# Patient Record
Sex: Female | Born: 1950 | Race: Black or African American | Hispanic: No | Marital: Single | Smoking: Current some day smoker
Health system: Southern US, Community
[De-identification: ages and names within clinical notes are randomized; demographics above are authoritative.]

## PROBLEM LIST (undated history)

## (undated) DIAGNOSIS — M1990 Unspecified osteoarthritis, unspecified site: Secondary | ICD-10-CM

## (undated) DIAGNOSIS — I10 Essential (primary) hypertension: Secondary | ICD-10-CM

## (undated) DIAGNOSIS — H409 Unspecified glaucoma: Secondary | ICD-10-CM

## (undated) DIAGNOSIS — I509 Heart failure, unspecified: Secondary | ICD-10-CM

## (undated) HISTORY — PX: WISDOM TOOTH EXTRACTION: SHX21

---

## 1997-07-08 ENCOUNTER — Other Ambulatory Visit: Admission: RE | Admit: 1997-07-08 | Discharge: 1997-07-08 | Payer: Self-pay | Admitting: Family Medicine

## 1997-07-27 ENCOUNTER — Other Ambulatory Visit: Admission: RE | Admit: 1997-07-27 | Discharge: 1997-07-27 | Payer: Self-pay | Admitting: Family Medicine

## 1998-08-18 ENCOUNTER — Other Ambulatory Visit: Admission: RE | Admit: 1998-08-18 | Discharge: 1998-08-18 | Payer: Self-pay | Admitting: Family Medicine

## 1998-12-20 ENCOUNTER — Encounter: Payer: Self-pay | Admitting: Emergency Medicine

## 1998-12-20 ENCOUNTER — Emergency Department (HOSPITAL_COMMUNITY): Admission: EM | Admit: 1998-12-20 | Discharge: 1998-12-20 | Payer: Self-pay | Admitting: Emergency Medicine

## 2000-02-24 ENCOUNTER — Other Ambulatory Visit: Admission: RE | Admit: 2000-02-24 | Discharge: 2000-02-24 | Payer: Self-pay | Admitting: Family Medicine

## 2000-05-23 ENCOUNTER — Ambulatory Visit (HOSPITAL_COMMUNITY): Admission: RE | Admit: 2000-05-23 | Discharge: 2000-05-23 | Payer: Self-pay | Admitting: Family Medicine

## 2000-05-23 ENCOUNTER — Encounter: Payer: Self-pay | Admitting: Family Medicine

## 2002-07-28 ENCOUNTER — Emergency Department (HOSPITAL_COMMUNITY): Admission: EM | Admit: 2002-07-28 | Discharge: 2002-07-28 | Payer: Self-pay | Admitting: Emergency Medicine

## 2003-10-07 ENCOUNTER — Ambulatory Visit: Payer: Self-pay | Admitting: Family Medicine

## 2003-11-13 ENCOUNTER — Ambulatory Visit: Payer: Self-pay | Admitting: Family Medicine

## 2004-07-19 ENCOUNTER — Ambulatory Visit: Payer: Self-pay | Admitting: Family Medicine

## 2004-09-01 ENCOUNTER — Ambulatory Visit: Payer: Self-pay | Admitting: Family Medicine

## 2004-09-22 ENCOUNTER — Ambulatory Visit: Payer: Self-pay | Admitting: Family Medicine

## 2005-01-19 ENCOUNTER — Ambulatory Visit: Payer: Self-pay | Admitting: Internal Medicine

## 2005-02-06 ENCOUNTER — Ambulatory Visit: Payer: Self-pay | Admitting: Family Medicine

## 2005-02-22 ENCOUNTER — Ambulatory Visit: Payer: Self-pay | Admitting: Family Medicine

## 2005-05-24 ENCOUNTER — Ambulatory Visit: Payer: Self-pay | Admitting: Internal Medicine

## 2005-07-05 ENCOUNTER — Ambulatory Visit: Payer: Self-pay | Admitting: Family Medicine

## 2006-04-02 ENCOUNTER — Ambulatory Visit: Payer: Self-pay | Admitting: Family Medicine

## 2006-05-14 ENCOUNTER — Ambulatory Visit: Payer: Self-pay | Admitting: Family Medicine

## 2006-07-02 ENCOUNTER — Ambulatory Visit: Payer: Self-pay | Admitting: Family Medicine

## 2006-08-28 ENCOUNTER — Ambulatory Visit: Payer: Self-pay | Admitting: Cardiology

## 2006-08-28 ENCOUNTER — Observation Stay (HOSPITAL_COMMUNITY): Admission: EM | Admit: 2006-08-28 | Discharge: 2006-08-28 | Payer: Self-pay | Admitting: Emergency Medicine

## 2006-08-28 ENCOUNTER — Encounter: Payer: Self-pay | Admitting: Cardiology

## 2006-09-06 DIAGNOSIS — E669 Obesity, unspecified: Secondary | ICD-10-CM

## 2006-09-06 DIAGNOSIS — K219 Gastro-esophageal reflux disease without esophagitis: Secondary | ICD-10-CM

## 2006-09-06 DIAGNOSIS — L259 Unspecified contact dermatitis, unspecified cause: Secondary | ICD-10-CM

## 2006-09-06 DIAGNOSIS — R519 Headache, unspecified: Secondary | ICD-10-CM | POA: Insufficient documentation

## 2006-09-06 DIAGNOSIS — F329 Major depressive disorder, single episode, unspecified: Secondary | ICD-10-CM

## 2006-09-06 DIAGNOSIS — I10 Essential (primary) hypertension: Secondary | ICD-10-CM | POA: Insufficient documentation

## 2006-09-06 DIAGNOSIS — M1990 Unspecified osteoarthritis, unspecified site: Secondary | ICD-10-CM | POA: Insufficient documentation

## 2006-09-06 DIAGNOSIS — F411 Generalized anxiety disorder: Secondary | ICD-10-CM | POA: Insufficient documentation

## 2006-09-06 DIAGNOSIS — R51 Headache: Secondary | ICD-10-CM

## 2006-09-06 DIAGNOSIS — F17200 Nicotine dependence, unspecified, uncomplicated: Secondary | ICD-10-CM

## 2007-02-25 ENCOUNTER — Encounter (INDEPENDENT_AMBULATORY_CARE_PROVIDER_SITE_OTHER): Payer: Self-pay | Admitting: Family Medicine

## 2007-02-25 ENCOUNTER — Ambulatory Visit: Payer: Self-pay | Admitting: Family Medicine

## 2007-03-18 ENCOUNTER — Ambulatory Visit: Payer: Self-pay

## 2007-04-17 ENCOUNTER — Ambulatory Visit: Payer: Self-pay | Admitting: Internal Medicine

## 2007-11-14 ENCOUNTER — Ambulatory Visit: Payer: Self-pay | Admitting: Internal Medicine

## 2007-11-14 ENCOUNTER — Encounter (INDEPENDENT_AMBULATORY_CARE_PROVIDER_SITE_OTHER): Payer: Self-pay | Admitting: Adult Health

## 2007-11-14 LAB — CONVERTED CEMR LAB
ALT: 13 units/L (ref 0–35)
BUN: 11 mg/dL (ref 6–23)
Basophils Absolute: 0 10*3/uL (ref 0.0–0.1)
Basophils Relative: 0 % (ref 0–1)
CO2: 27 meq/L (ref 19–32)
Calcium: 9.4 mg/dL (ref 8.4–10.5)
Chloride: 104 meq/L (ref 96–112)
Cholesterol: 159 mg/dL (ref 0–200)
Creatinine, Ser: 0.69 mg/dL (ref 0.40–1.20)
Eosinophils Relative: 5 % (ref 0–5)
HCT: 44.3 % (ref 36.0–46.0)
HDL: 50 mg/dL (ref >39)
Hemoglobin: 14.4 g/dL (ref 12.0–15.0)
Lymphocytes Relative: 52 % — ABNORMAL HIGH (ref 12–46)
MCHC: 32.5 g/dL (ref 30.0–36.0)
Monocytes Absolute: 0.5 10*3/uL (ref 0.1–1.0)
Monocytes Relative: 6 % (ref 3–12)
Neutro Abs: 2.9 10*3/uL (ref 1.7–7.7)
RBC: 4.83 M/uL (ref 3.87–5.11)
RDW: 14.8 % (ref 11.5–15.5)
Total Bilirubin: 0.3 mg/dL (ref 0.3–1.2)
Total CHOL/HDL Ratio: 3.2
VLDL: 29 mg/dL (ref 0–40)

## 2008-03-18 ENCOUNTER — Encounter (INDEPENDENT_AMBULATORY_CARE_PROVIDER_SITE_OTHER): Payer: Self-pay | Admitting: Adult Health

## 2008-03-18 ENCOUNTER — Ambulatory Visit: Payer: Self-pay | Admitting: Family Medicine

## 2008-03-18 LAB — CONVERTED CEMR LAB
BUN: 13 mg/dL (ref 6–23)
Basophils Absolute: 0 10*3/uL (ref 0.0–0.1)
Basophils Relative: 0 % (ref 0–1)
CO2: 24 meq/L (ref 19–32)
Creatinine, Ser: 0.61 mg/dL (ref 0.40–1.20)
Eosinophils Absolute: 0.2 10*3/uL (ref 0.0–0.7)
Eosinophils Relative: 3 % (ref 0–5)
Glucose, Bld: 105 mg/dL — ABNORMAL HIGH (ref 70–99)
HCT: 42.7 % (ref 36.0–46.0)
Hemoglobin: 14 g/dL (ref 12.0–15.0)
MCHC: 32.8 g/dL (ref 30.0–36.0)
MCV: 89.9 fL (ref 78.0–100.0)
Monocytes Absolute: 0.6 10*3/uL (ref 0.1–1.0)
Monocytes Relative: 7 % (ref 3–12)
Platelets: 247 10*3/uL (ref 150–400)
RDW: 15.3 % (ref 11.5–15.5)
Sodium: 144 meq/L (ref 135–145)
TSH: 0.764 microintl units/mL (ref 0.350–4.500)
Total Bilirubin: 0.4 mg/dL (ref 0.3–1.2)
Total Protein: 7.7 g/dL (ref 6.0–8.3)

## 2008-03-19 ENCOUNTER — Encounter (INDEPENDENT_AMBULATORY_CARE_PROVIDER_SITE_OTHER): Payer: Self-pay | Admitting: Adult Health

## 2008-05-19 ENCOUNTER — Ambulatory Visit (HOSPITAL_COMMUNITY): Admission: RE | Admit: 2008-05-19 | Discharge: 2008-05-19 | Payer: Self-pay | Admitting: Family Medicine

## 2008-06-11 ENCOUNTER — Encounter: Admission: RE | Admit: 2008-06-11 | Discharge: 2008-06-11 | Payer: Self-pay | Admitting: Internal Medicine

## 2008-06-11 DIAGNOSIS — N63 Unspecified lump in unspecified breast: Secondary | ICD-10-CM

## 2008-06-19 ENCOUNTER — Encounter (INDEPENDENT_AMBULATORY_CARE_PROVIDER_SITE_OTHER): Payer: Self-pay | Admitting: Adult Health

## 2008-06-19 ENCOUNTER — Ambulatory Visit: Payer: Self-pay | Admitting: Family Medicine

## 2008-06-19 LAB — CONVERTED CEMR LAB
BUN: 12 mg/dL (ref 6–23)
Creatinine, Ser: 0.71 mg/dL (ref 0.40–1.20)
Potassium: 3.8 meq/L (ref 3.5–5.3)

## 2008-09-09 DIAGNOSIS — Z8639 Personal history of other endocrine, nutritional and metabolic disease: Secondary | ICD-10-CM

## 2008-09-09 DIAGNOSIS — Z87891 Personal history of nicotine dependence: Secondary | ICD-10-CM

## 2008-09-09 DIAGNOSIS — E119 Type 2 diabetes mellitus without complications: Secondary | ICD-10-CM | POA: Insufficient documentation

## 2008-09-09 DIAGNOSIS — Z9189 Other specified personal risk factors, not elsewhere classified: Secondary | ICD-10-CM | POA: Insufficient documentation

## 2008-09-09 DIAGNOSIS — Z862 Personal history of diseases of the blood and blood-forming organs and certain disorders involving the immune mechanism: Secondary | ICD-10-CM

## 2008-10-19 ENCOUNTER — Encounter (INDEPENDENT_AMBULATORY_CARE_PROVIDER_SITE_OTHER): Payer: Self-pay | Admitting: Adult Health

## 2008-10-19 ENCOUNTER — Ambulatory Visit: Payer: Self-pay | Admitting: Internal Medicine

## 2008-10-19 LAB — CONVERTED CEMR LAB
AST: 16 units/L (ref 0–37)
Albumin: 4.3 g/dL (ref 3.5–5.2)
Alkaline Phosphatase: 99 units/L (ref 39–117)
Calcium: 9.7 mg/dL (ref 8.4–10.5)
Chloride: 107 meq/L (ref 96–112)
Glucose, Bld: 58 mg/dL — ABNORMAL LOW (ref 70–99)
LDL Cholesterol: 115 mg/dL — ABNORMAL HIGH (ref 0–99)
Potassium: 3.8 meq/L (ref 3.5–5.3)
Sodium: 145 meq/L (ref 135–145)
Total Protein: 7.3 g/dL (ref 6.0–8.3)

## 2008-11-17 ENCOUNTER — Ambulatory Visit: Payer: Self-pay | Admitting: Internal Medicine

## 2008-12-18 ENCOUNTER — Encounter (INDEPENDENT_AMBULATORY_CARE_PROVIDER_SITE_OTHER): Payer: Self-pay | Admitting: Adult Health

## 2008-12-18 ENCOUNTER — Ambulatory Visit: Payer: Self-pay | Admitting: Internal Medicine

## 2008-12-18 LAB — CONVERTED CEMR LAB
ALT: 10 units/L (ref 0–35)
Albumin: 3.9 g/dL (ref 3.5–5.2)
CO2: 25 meq/L (ref 19–32)
Calcium: 8.8 mg/dL (ref 8.4–10.5)
Chloride: 105 meq/L (ref 96–112)
Cholesterol: 167 mg/dL (ref 0–200)
Sodium: 143 meq/L (ref 135–145)
Total Protein: 6.9 g/dL (ref 6.0–8.3)

## 2009-02-10 ENCOUNTER — Ambulatory Visit: Payer: Self-pay | Admitting: Internal Medicine

## 2009-02-16 ENCOUNTER — Ambulatory Visit: Payer: Self-pay | Admitting: Internal Medicine

## 2009-02-18 ENCOUNTER — Ambulatory Visit: Payer: Self-pay | Admitting: Internal Medicine

## 2009-05-14 ENCOUNTER — Ambulatory Visit: Payer: Self-pay | Admitting: Internal Medicine

## 2010-02-07 ENCOUNTER — Encounter: Payer: Self-pay | Admitting: Internal Medicine

## 2010-02-07 ENCOUNTER — Encounter (INDEPENDENT_AMBULATORY_CARE_PROVIDER_SITE_OTHER): Payer: Self-pay | Admitting: *Deleted

## 2010-02-07 LAB — CONVERTED CEMR LAB
Albumin: 3.9 g/dL (ref 3.5–5.2)
Alkaline Phosphatase: 96 units/L (ref 39–117)
CO2: 27 meq/L (ref 19–32)
Chloride: 105 meq/L (ref 96–112)
Glucose, Bld: 97 mg/dL (ref 70–99)
Potassium: 4.2 meq/L (ref 3.5–5.3)
Sodium: 144 meq/L (ref 135–145)
Total Protein: 7.1 g/dL (ref 6.0–8.3)

## 2010-05-24 NOTE — H&P (Signed)
NAMEMARAYA, GWILLIAM                ACCOUNT NO.:  [id number]   MEDICAL RECORD NO.:  [id number]          PATIENT TYPE:  INP   LOCATION:  3733                         FACILITY:  [facility]   PHYSICIAN:  Gerrit Friends. Dietrich Pates, MD, FACCDATE OF BIRTH:  [birth year]   DATE OF ADMISSION:  08/28/2006  DATE OF DISCHARGE:                              HISTORY & PHYSICAL   PRIMARY CARDIOLOGIST:  Will be new, Dr. Villisca Bing   PRIMARY CARE PHYSICIAN:  Dr. Margarette Canada at [facility]   HISTORY OF PRESENT ILLNESS:  This is a 60 year old African-American  female who woke up this morning with severe chest pain around 3:30 a.m.  which she described as heaviness, nausea, extreme pressure across her  chest lasting 15 to 20 minutes; she had a little trouble breathing and  felt warm all over with mild dizziness; she called EMS, took one baby  aspirin.  She was given nitroglycerin sublingual by EMS and four baby  aspirin, placed on O2 and had some relief of pain.  On arrival to the  emergency room, she was given morphine IV and feels much better.  She  denies any prior cardiac workup, cardiac catheterization or stress test.  She has had some kind of feelings of this before in the past when she  drank something with a lot of bubbles in it and she felt like it was  related to that, but this is the most severe she has felt.   REVIEW OF SYSTEMS:  Positive for headache, chest pain, mild shortness of  breath, dyspnea on exertion, arthralgia in the knees and back, mild  nausea, history of GERD and abdominal pain.   PAST MEDICAL HISTORY:  1. Hypertension.  2. Diabetes.  3. Glaucoma.  4. Arthritis.  5. Questionable hypercholesterolemia.   PAST SURGICAL HISTORY:  C-section.   SOCIAL:  She lives in [place] with her son.  She is unemployed.  She  is single.  She is a three-pack-a-day smoker for 35 years, negative for  ETOH, negative for drugs.   FAMILY HISTORY:  Mother died from complications of heart disease.  Father died of a questionable MI.  She has siblings, but they have  unknown health issues.   CURRENT MEDICATIONS AT HOME:  1. Benazepril/HCTZ 20/25 mg once day.  2. Metoprolol 100 mg once a day.  3. Simvastatin 20 mg at h.s.  4. Ibuprofen 800 mg three times a day.  5. eye drops twice a day.  6. Benicar 20 mg once a day.  7. Metformin 500 mg b.i.d.  8. Atenolol 50 mg once a day.   ALLERGIES:  No known drug allergies.   LABS:  Sodium 141, potassium 3.1, chloride 108, CO2 36, BUN 8,  creatinine 0.6, glucose 154, hemoglobin 15.0, hematocrit 44.0.  Point-of-  care markers:  Myoglobin 36.5, CK-MB less than 1.0, troponin less than  0.05.   EKG revealing normal sinus rhythm with ventricular rate of 80 beats per  minute.   PHYSICAL EXAMINATION:  VITAL SIGNS:  Blood pressure 153/77, pulse 90,  respirations 22, temperature 97.9, O2 sat 100% on three liters.  HEENT:  Head is normocephalic, atraumatic.  Eyes:  PERRLA.  Mucous  membranes and mouth are pink and moist.  Tongue is midline.  NECK:  Supple.  There is no JVD.  No carotid bruits appreciated.  CARDIOVASCULAR:  Regular rate and rhythm without murmurs, rubs or  gallops.  Pulses are 2+ and equal without bruits.  LUNGS:  Clear to auscultation without wheezes, rales or rhonchi.  ABDOMEN:  Obese, nontender.  2+ bowel sounds.  No rebound or guarding.  EXTREMITIES:  Without clubbing, cyanosis or edema.  MUSCULOSKELETAL:  She does have some pain in her knees and lower back,  but no deformities are noted.  NEURO:  Cranial nerves II-XII are grossly intact.   Chest x-ray reveals mild bronchitic changes and hyperinflation.   CARDIOVASCULAR RISK FACTORS:  Hypertension, diabetes, tobacco, family  history, obesity and unknown lipid levels.   IMPRESSION:  1. Chest pain.  2. Diabetes.  3. Hypertension.  4. Obesity.  5. Tobacco abuse.  6. Hypokalemia.   PLAN:  The patient has been interviewed and examined by Dr. [place] Bing.  The  patient has multiple cardiovascular risk factors,  worrisome symptoms with benign exam and EKG.  The patient will be  admitted for 24-hour observation, rule out myocardial infarction, acute  cholecystitis, and evaluate GI with amylase and lipase.  If the  initial workup is negative, she will be scheduled for an outpatient  stress test.  Smoking cessation will be completed during  hospitalization.  We will replete her potassium and check hemoglobin  A1c.  An echocardiogram will also be completed for left ventricular  function with known history of hypertension and diabetes.      Bettey Mare. Lyman Bishop, NP      Gerrit Friends. Dietrich Pates, MD, [facility]  Electronically Signed    KML/MEDQ  D:  08/28/2006  T:  08/28/2006  Job:  [id number]

## 2010-05-27 NOTE — Discharge Summary (Signed)
Rachel Wilcox, Rachel Wilcox                ACCOUNT NO.:  [id number]   MEDICAL RECORD NO.:  [id number]          PATIENT TYPE:  OBV   LOCATION:  3733                         FACILITY:  [facility]   PHYSICIAN:  Gerrit Friends. Dietrich Pates, MD, FACCDATE OF BIRTH:  [birth year]   DATE OF ADMISSION:  08/28/2006  DATE OF DISCHARGE:  08/28/2006                               DISCHARGE SUMMARY   PRIMARY CARDIOLOGIST:  Gerrit Friends. Dietrich Pates, MD, [facility]   PRIMARY CARE PHYSICIAN:  Dr. Margarette Canada at [facility].   FINAL DIAGNOSES:  1. Chest pain, unknown etiology.  2. Against medical advice sign out.  3. Diabetes.  4. Glaucoma.  5. Arthritis.  6. Questionable hypercholesterolemia.   PROCEDURES PERFORMED DURING HOSPITALIZATION:  None.   HOSPITAL COURSE:  This is a 60 year old African American female who woke  up the morning of admission with severe chest pain around 3:30 a.m.  described as heaviness, nausea with extreme pressure across her chest  lasting 15 to 20 minutes with trouble breathing.  Felt warm all over  with mild dizziness.  EMS was called.  She was given baby aspirin,  nitroglycerin, placed on oxygen and presented to the emergency room.  On  arrival to the emergency room the patient was given morphine and felt  much better.  She has had no prior cardiac work up or catheterization  prior to this admission.  The patient was admitted to rule out  myocardial infarction and plan for further cardiac work up.  The patient  would be scheduled for a stress test versus cardiac catheterization  depending upon results of cardiac markers.  Unfortunately, the patient  refused to stay in the hospital secondary to severe agitation.  The  patient also wanted to smoke and not take medications as prescribed.  It  was advised that the patient remain in her room, stop smoking and accept  medication and treatment.  She refused and signed out AMA the evening of  August 28, 2006 with no further cardiac work up or testing.  The  patient  did not accept prescriptions for home or accept a follow up appointment.  During her brief hospitalization, the patient did have cardiac markers  cycled which were found to be negative with a troponin of 0.04 and 0.03,  respectively.  The patient's urine drug screen was positive for opiates.  The patient also was negative for marijuana, cocaine or amphetamines.  The patient's chest x-ray did not reveal any acute cardiac process.  Her  abdomen was also evaluated revealing nondistended thick wall gallbladder  despite the patient having been NPO, suggesting chronic cholecystitis  with 2.13 mm hepatic cysts which were found on previous studies.   Unfortunately, the patient did not stay long enough for any further  testing having signed out AMA.  The patient has no follow up appointment  and it is hopeful that the patient will followup with her primary care  physician, Dr. Margarette Canada, at [facility].      Bettey Mare. Lyman Bishop, NP      Gerrit Friends. Dietrich Pates, MD, [facility]  Electronically Signed    KML/MEDQ  D:  09/27/2006  T:  09/28/2006  Job:  [id number]   cc:   Dr. Margarette Canada

## 2010-10-21 LAB — PROTIME-INR
INR: 1
Prothrombin Time: 13.1

## 2010-10-21 LAB — CARDIAC PANEL(CRET KIN+CKTOT+MB+TROPI)
Relative Index: 1.1
Total CK: 109
Troponin I: 0.03

## 2010-10-21 LAB — POCT CARDIAC MARKERS
Myoglobin, poc: 36.5
Operator id: 279831
Operator id: 279831
Troponin i, poc: <0.05

## 2010-10-21 LAB — BASIC METABOLIC PANEL
CO2: 26
Chloride: 109
Glucose, Bld: 156 — ABNORMAL HIGH
Potassium: 3.5
Sodium: 143

## 2010-10-21 LAB — I-STAT 8, (EC8 V) (CONVERTED LAB)
Acid-Base Excess: 3 — ABNORMAL HIGH
Chloride: 108
HCT: 44
Potassium: 3.1 — ABNORMAL LOW
pH, Ven: 7.467 — ABNORMAL HIGH

## 2010-10-21 LAB — CBC
HCT: 40.8
Hemoglobin: 13.7
MCHC: 33.5
MCV: 89.7
RDW: 15.2 — ABNORMAL HIGH

## 2010-10-21 LAB — TSH: TSH: 4.731

## 2010-10-21 LAB — RAPID URINE DRUG SCREEN, HOSP PERFORMED: Tetrahydrocannabinol: NOT DETECTED

## 2010-10-21 LAB — TROPONIN I: Troponin I: 0.04

## 2010-10-21 LAB — POCT I-STAT CREATININE: Creatinine, Ser: 0.6

## 2011-01-03 ENCOUNTER — Emergency Department (HOSPITAL_COMMUNITY): Payer: No Typology Code available for payment source

## 2011-01-03 ENCOUNTER — Encounter: Payer: Self-pay | Admitting: *Deleted

## 2011-01-03 ENCOUNTER — Emergency Department (HOSPITAL_COMMUNITY)
Admission: EM | Admit: 2011-01-03 | Discharge: 2011-01-03 | Disposition: A | Discharge status: Home / Self Care | Payer: No Typology Code available for payment source | Attending: Emergency Medicine | Admitting: Emergency Medicine

## 2011-01-03 DIAGNOSIS — T1490XA Injury, unspecified, initial encounter: Secondary | ICD-10-CM | POA: Insufficient documentation

## 2011-01-03 DIAGNOSIS — R21 Rash and other nonspecific skin eruption: Secondary | ICD-10-CM | POA: Insufficient documentation

## 2011-01-03 DIAGNOSIS — M542 Cervicalgia: Secondary | ICD-10-CM | POA: Insufficient documentation

## 2011-01-03 DIAGNOSIS — M25529 Pain in unspecified elbow: Secondary | ICD-10-CM | POA: Insufficient documentation

## 2011-01-03 DIAGNOSIS — M79609 Pain in unspecified limb: Secondary | ICD-10-CM | POA: Insufficient documentation

## 2011-01-03 DIAGNOSIS — R609 Edema, unspecified: Secondary | ICD-10-CM | POA: Insufficient documentation

## 2011-01-03 DIAGNOSIS — R079 Chest pain, unspecified: Secondary | ICD-10-CM | POA: Insufficient documentation

## 2011-01-03 HISTORY — DX: Essential (primary) hypertension: I10

## 2011-01-03 MED ORDER — IBUPROFEN 800 MG PO TABS: 800.0000 mg | ORAL_TABLET | Freq: Three times a day (TID) | ORAL | Status: AC

## 2011-01-03 MED ORDER — OXYCODONE-ACETAMINOPHEN 5-325 MG PO TABS: 1.0000 | ORAL_TABLET | ORAL | Status: AC | PRN

## 2011-01-03 MED ORDER — CYCLOBENZAPRINE HCL 10 MG PO TABS
10.0000 mg | ORAL_TABLET | Freq: Once | ORAL | Status: AC
  Administered 2011-01-03: 10 mg via ORAL
  Filled 2011-01-03: qty 1

## 2011-01-03 MED ORDER — IBUPROFEN 800 MG PO TABS
800.0000 mg | ORAL_TABLET | Freq: Once | ORAL | Status: AC
  Administered 2011-01-03: 800 mg via ORAL
  Filled 2011-01-03: qty 1
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
## 2011-01-04 NOTE — ED Provider Notes (Signed)
Medical screening examination/treatment/procedure(s) were performed by non-physician practitioner and as supervising physician I was immediately available for consultation/collaboration.   Anastaisa Wooding L Markela Wee, MD 01/04/11 1026 

## 2011-02-13 ENCOUNTER — Other Ambulatory Visit (HOSPITAL_COMMUNITY)
Admission: RE | Admit: 2011-02-13 | Discharge: 2011-02-13 | Disposition: A | Discharge status: Home / Self Care | Payer: Medicaid Other | Source: Ambulatory Visit | Attending: Family Medicine | Admitting: Family Medicine

## 2011-02-13 ENCOUNTER — Other Ambulatory Visit: Payer: Self-pay | Admitting: Family Medicine

## 2011-02-13 DIAGNOSIS — Z01419 Encounter for gynecological examination (general) (routine) without abnormal findings: Secondary | ICD-10-CM | POA: Insufficient documentation

## 2011-02-15 ENCOUNTER — Other Ambulatory Visit (HOSPITAL_COMMUNITY): Payer: Self-pay | Admitting: Family Medicine

## 2011-02-15 DIAGNOSIS — Z1231 Encounter for screening mammogram for malignant neoplasm of breast: Secondary | ICD-10-CM

## 2011-04-18 ENCOUNTER — Ambulatory Visit (HOSPITAL_COMMUNITY): Payer: No Typology Code available for payment source

## 2011-05-11 ENCOUNTER — Ambulatory Visit (HOSPITAL_COMMUNITY): Payer: No Typology Code available for payment source | Attending: Family Medicine

## 2011-12-19 ENCOUNTER — Emergency Department (HOSPITAL_COMMUNITY)
Admission: EM | Admit: 2011-12-19 | Discharge: 2011-12-19 | Disposition: A | Discharge status: Home / Self Care | Payer: Medicaid Other | Source: Home / Self Care | Attending: Family Medicine | Admitting: Family Medicine

## 2011-12-19 ENCOUNTER — Encounter (HOSPITAL_COMMUNITY): Payer: Self-pay | Admitting: Emergency Medicine

## 2011-12-19 DIAGNOSIS — I10 Essential (primary) hypertension: Secondary | ICD-10-CM

## 2011-12-19 LAB — POCT I-STAT, CHEM 8
BUN: 11 mg/dL (ref 6–23)
Creatinine, Ser: 0.8 mg/dL (ref 0.50–1.10)
Potassium: 3.5 mEq/L (ref 3.5–5.1)
Sodium: 143 mEq/L (ref 135–145)

## 2011-12-19 MED ORDER — BENAZEPRIL-HYDROCHLOROTHIAZIDE 20-12.5 MG PO TABS: 1.0000 | ORAL_TABLET | Freq: Two times a day (BID) | ORAL | Status: DC

## 2011-12-19 NOTE — ED Provider Notes (Signed)
History     CSN: [id number]  Arrival date & time 12/19/11  4098   First MD Initiated Contact with Patient 12/19/11 1940      Chief Complaint  Patient presents with  . Hypertension    (Consider location/radiation/quality/duration/timing/severity/associated sxs/prior treatment) Patient is a 61 y.o. female presenting with hypertension. The history is provided by the patient.  Hypertension This is a chronic problem. The current episode started more than 2 days ago. The problem has been gradually worsening (has run out of bp meds and confused about meds and dosing.). Pertinent negatives include no chest pain, no abdominal pain and no shortness of breath.    Past Medical History  Diagnosis Date  . Diabetes mellitus   . Hypertension     Past Surgical History  Procedure Date  . Cesarean section     No family history on file.  History  Substance Use Topics  . Smoking status: Current Every Day Smoker  . Smokeless tobacco: Not on file  . Alcohol Use: Yes     Comment: rarely    OB History    Grav Para Term Preterm Abortions TAB SAB Ect Mult Living                  Review of Systems  Constitutional: Negative.   Respiratory: Negative for cough and shortness of breath.   Cardiovascular: Negative for chest pain and leg swelling.  Gastrointestinal: Negative for abdominal pain.    Allergies  Latex  Home Medications   Current Outpatient Rx  Name  Route  Sig  Dispense  Refill  . ASPIRIN 81 MG PO TABS   Oral   Take 81 mg by mouth daily.         . ATENOLOL 50 MG PO TABS   Oral   Take 50 mg by mouth daily.         Marland Kitchen BENAZEPRIL HCL 40 MG PO TABS   Oral   Take 40 mg by mouth daily.         Marland Kitchen GLIMEPIRIDE 4 MG PO TABS   Oral   Take 4 mg by mouth daily before breakfast.         . BENAZEPRIL-HYDROCHLOROTHIAZIDE 20-12.5 MG PO TABS   Oral   Take 1 tablet by mouth 2 (two) times daily.   60 tablet   1     BP 179/101  Pulse 71  Temp 98 F (36.7 C) (Oral)   Resp 20  SpO2 100%  Physical Exam  Nursing note and vitals reviewed. Constitutional: She is oriented to person, place, and time. She appears well-developed and well-nourished.  Eyes: Pupils are equal, round, and reactive to light.  Neck: Normal range of motion. Neck supple.  Cardiovascular: Normal rate, regular rhythm, normal heart sounds and intact distal pulses.   Pulmonary/Chest: Effort normal and breath sounds normal.  Musculoskeletal: She exhibits no edema.  Lymphadenopathy:    She has no cervical adenopathy.  Neurological: She is alert and oriented to person, place, and time.  Skin: Skin is warm and dry.    ED Course  Procedures (including critical care time)   Labs Reviewed  POCT I-STAT, CHEM 8   No results found.   1. Hypertension       MDM          Linna Hoff, MD 12/19/11 2027

## 2011-12-19 NOTE — ED Notes (Signed)
1853  Called to registration to check pt.   VS checked by Milda Smart and was 174/69.  Pt. States she is on a BP med prescribed by [facility]. States she had 2 refills but does not think it is controlling her BP as well as her previous medicine whtch was changed due to frequency of urination.  BP at home was 218/ 112 and 204 /113.  Has not found a new PCP yet.  C/o some lightheadedness but in no acute distress. Pt. Instructed to wait in waiting room and report any new or worsening symptoms. Vassie Moselle 12/19/2011

## 2011-12-19 NOTE — ED Notes (Signed)
Patient reports she checks blood pressure daily.  Noticed bp running higher 2 days ago.

## 2013-10-20 ENCOUNTER — Encounter (HOSPITAL_COMMUNITY): Payer: Self-pay | Admitting: Emergency Medicine

## 2013-10-20 ENCOUNTER — Emergency Department (HOSPITAL_COMMUNITY)
Admission: EM | Admit: 2013-10-20 | Discharge: 2013-10-20 | Disposition: A | Discharge status: Home / Self Care | Payer: Medicaid Other | Attending: Emergency Medicine | Admitting: Emergency Medicine

## 2013-10-20 DIAGNOSIS — M62838 Other muscle spasm: Secondary | ICD-10-CM | POA: Diagnosis not present

## 2013-10-20 DIAGNOSIS — Z7982 Long term (current) use of aspirin: Secondary | ICD-10-CM | POA: Insufficient documentation

## 2013-10-20 DIAGNOSIS — Z91040 Latex allergy status: Secondary | ICD-10-CM | POA: Diagnosis not present

## 2013-10-20 DIAGNOSIS — E119 Type 2 diabetes mellitus without complications: Secondary | ICD-10-CM | POA: Insufficient documentation

## 2013-10-20 DIAGNOSIS — Z79899 Other long term (current) drug therapy: Secondary | ICD-10-CM | POA: Diagnosis not present

## 2013-10-20 DIAGNOSIS — M436 Torticollis: Secondary | ICD-10-CM | POA: Diagnosis not present

## 2013-10-20 DIAGNOSIS — S79922A Unspecified injury of left thigh, initial encounter: Secondary | ICD-10-CM | POA: Diagnosis not present

## 2013-10-20 DIAGNOSIS — Y9389 Activity, other specified: Secondary | ICD-10-CM | POA: Diagnosis not present

## 2013-10-20 DIAGNOSIS — Y92410 Unspecified street and highway as the place of occurrence of the external cause: Secondary | ICD-10-CM | POA: Insufficient documentation

## 2013-10-20 DIAGNOSIS — Z720 Tobacco use: Secondary | ICD-10-CM | POA: Diagnosis not present

## 2013-10-20 DIAGNOSIS — I10 Essential (primary) hypertension: Secondary | ICD-10-CM | POA: Diagnosis not present

## 2013-10-20 DIAGNOSIS — S199XXA Unspecified injury of neck, initial encounter: Secondary | ICD-10-CM | POA: Diagnosis not present

## 2013-10-20 DIAGNOSIS — M79605 Pain in left leg: Secondary | ICD-10-CM

## 2013-10-20 MED ORDER — NAPROXEN 500 MG PO TABS: 500.0000 mg | ORAL_TABLET | Freq: Two times a day (BID) | ORAL | Status: DC | PRN

## 2013-10-20 MED ORDER — CYCLOBENZAPRINE HCL 10 MG PO TABS: 5.0000 mg | ORAL_TABLET | Freq: Three times a day (TID) | ORAL | Status: DC | PRN

## 2013-10-20 MED ORDER — HYDROCODONE-ACETAMINOPHEN 5-325 MG PO TABS: 1.0000 | ORAL_TABLET | Freq: Four times a day (QID) | ORAL | Status: DC | PRN
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
## 2013-10-20 NOTE — ED Notes (Signed)
Advised to follow up with PCP regarding BP. Knows not to drink alcohol/drive/oeprate heavy machinery with prescribed medications. Ambulatory with steady gait. Moving all extremities. No other questions/concerns.
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
## 2013-10-23 ENCOUNTER — Emergency Department (INDEPENDENT_AMBULATORY_CARE_PROVIDER_SITE_OTHER)
Admission: EM | Admit: 2013-10-23 | Discharge: 2013-10-23 | Disposition: A | Discharge status: Home / Self Care | Payer: PRIVATE HEALTH INSURANCE | Source: Home / Self Care | Attending: Family Medicine | Admitting: Family Medicine

## 2013-10-23 ENCOUNTER — Emergency Department (INDEPENDENT_AMBULATORY_CARE_PROVIDER_SITE_OTHER): Payer: Medicaid Other

## 2013-10-23 ENCOUNTER — Encounter (HOSPITAL_COMMUNITY): Payer: Self-pay | Admitting: Emergency Medicine

## 2013-10-23 DIAGNOSIS — S9031XA Contusion of right foot, initial encounter: Secondary | ICD-10-CM | POA: Diagnosis not present

## 2013-10-23 MED ORDER — TRAMADOL HCL 50 MG PO TABS: 50.0000 mg | ORAL_TABLET | Freq: Four times a day (QID) | ORAL | Status: DC | PRN
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
## 2013-10-23 NOTE — Discharge Instructions (Signed)
Foot Contusion  A foot contusion is a deep bruise to the foot. Contusions happen when an injury causes bleeding under the skin. Signs of bruising include pain, puffiness (swelling), and discolored skin. The contusion may turn blue, purple, or yellow. HOME CARE  Put ice on the injured area.  Put ice in a plastic bag.  Place a towel between your skin and the bag.  Leave the ice on for 15-20 minutes, 03-04 times a day.  Only take medicines as told by your doctor.  Use an elastic wrap only as told. You may remove the wrap for sleeping, showering, and bathing. Take the wrap off if you lose feeling (numb) in your toes, or they turn blue or cold. Put the wrap on more loosely.  Keep the foot raised (elevated) with pillows.  If your foot hurts, avoid standing or walking.  When your doctor says it is okay to use your foot, start using it slowly. If you have pain, lessen how much you use your foot.  See your doctor as told. GET HELP RIGHT AWAY IF:   You have more redness, puffiness, or pain in your foot.  Your puffiness or pain does not get better with medicine.  You lose feeling in your foot, or you cannot move your toes.  Your foot turns cold or blue.  You have pain when you move your toes.  Your foot feels warm.  Your contusion does not get better in 2 days. MAKE SURE YOU:   Understand these instructions.  Will watch this condition.  Will get help right away if you or your child is not doing well or gets worse. Document Released: 10/05/2007 Document Revised: 06/27/2011 Document Reviewed: 11/29/2010 [facility] Patient Information 2015 [place], [place]. This information is not intended to replace advice given to you by your health care provider. Make sure you discuss any questions you have with your health care provider.

## 2013-10-25 NOTE — ED Provider Notes (Signed)
Medical screening examination/treatment/procedure(s) were performed by non-physician practitioner and as supervising physician I was immediately available for consultation/collaboration.   EKG Interpretation None       Babette Relic, MD 10/25/13 2158

## 2013-10-30 ENCOUNTER — Emergency Department (INDEPENDENT_AMBULATORY_CARE_PROVIDER_SITE_OTHER)
Admission: EM | Admit: 2013-10-30 | Discharge: 2013-10-30 | Disposition: A | Discharge status: Home / Self Care | Payer: PRIVATE HEALTH INSURANCE | Source: Home / Self Care | Attending: Family Medicine | Admitting: Family Medicine

## 2013-10-30 ENCOUNTER — Ambulatory Visit (HOSPITAL_COMMUNITY)
Admit: 2013-10-30 | Discharge: 2013-10-30 | Disposition: A | Discharge status: Home / Self Care | Payer: Medicaid Other | Attending: Family Medicine | Admitting: Family Medicine

## 2013-10-30 ENCOUNTER — Encounter (HOSPITAL_COMMUNITY): Payer: Self-pay | Admitting: Emergency Medicine

## 2013-10-30 DIAGNOSIS — M25552 Pain in left hip: Secondary | ICD-10-CM | POA: Diagnosis not present

## 2013-10-30 DIAGNOSIS — M25562 Pain in left knee: Secondary | ICD-10-CM | POA: Diagnosis not present

## 2013-10-30 DIAGNOSIS — M7062 Trochanteric bursitis, left hip: Secondary | ICD-10-CM

## 2013-10-30 MED ORDER — PREDNISONE 10 MG PO KIT: PACK | ORAL | Status: DC

## 2013-10-30 NOTE — Discharge Instructions (Signed)
Thank you for coming in today. Take prednisone daily for 6 days.  Followup with Dr. Alfonso Ramus at [facility].  Attended physical therapy at [facility]. Bring the sheet of paper to the physical therapy office.  Trochanteric Bursitis You have hip pain due to trochanteric bursitis. Bursitis means that the sack near the outside of the hip is filled with fluid and inflamed. This sack is made up of protective soft tissue. The pain from trochanteric bursitis can be severe and keep you from sleep. It can radiate to the buttocks or down the outside of the thigh to the knee. The pain is almost always worse when rising from the seated or lying position and with walking. Pain can improve after you take a few steps. It happens more often in people with hip joint and lumbar spine problems, such as arthritis or previous surgery. Very rarely the trochanteric bursa can become infected, and antibiotics and/or surgery may be needed. Treatment often includes an injection of local anesthetic mixed with cortisone medicine. This medicine is injected into the area where it is most tender over the hip. Repeat injections may be necessary if the response to treatment is slow. You can apply ice packs over the tender area for 30 minutes every 2 hours for the next few days. Anti-inflammatory and/or narcotic pain medicine may also be helpful. Limit your activity for the next few days if the pain continues. See your caregiver in 5-10 days if you are not greatly improved.  SEEK IMMEDIATE MEDICAL CARE IF:  You develop severe pain, fever, or increased redness.  You have pain that radiates below the knee. EXERCISES STRETCHING EXERCISES - Trochanteric Bursitis  These exercises may help you when beginning to rehabilitate your injury. Your symptoms may resolve with or without further involvement from your physician, physical therapist, or athletic trainer. While completing these exercises, remember:   Restoring  tissue flexibility helps normal motion to return to the joints. This allows healthier, less painful movement and activity.  An effective stretch should be held for at least 30 seconds.  A stretch should never be painful. You should only feel a gentle lengthening or release in the stretched tissue. STRETCH - Iliotibial Band  On the floor or bed, lie on your side so your injured leg is on top. Bend your knee and grab your ankle.  Slowly bring your knee back so that your thigh is in line with your trunk. Keep your heel at your buttocks and gently arch your back so your head, shoulders and hips line up.  Slowly lower your leg so that your knee approaches the floor/bed until you feel a gentle stretch on the outside of your thigh. If you do not feel a stretch and your knee will not fall farther, place the heel of your opposite foot on top of your knee and pull your thigh down farther.  Hold this stretch for __________ seconds.  Repeat __________ times. Complete this exercise __________ times per day. STRETCH - Hamstrings, Supine   Lie on your back. Loop a belt or towel over the ball of your foot as shown.  Straighten your knee and slowly pull on the belt to raise your injured leg. Do not allow the knee to bend. Keep your opposite leg flat on the floor.  Raise the leg until you feel a gentle stretch behind your knee or thigh. Hold this position for __________ seconds.  Repeat __________ times. Complete this stretch __________ times per day. STRETCH - Quadriceps,  Prone   Lie on your stomach on a firm surface, such as a bed or padded floor.  Bend your knee and grasp your ankle. If you are unable to reach your ankle or pant leg, use a belt around your foot to lengthen your reach.  Gently pull your heel toward your buttocks. Your knee should not slide out to the side. You should feel a stretch in the front of your thigh and/or knee.  Hold this position for __________ seconds.  Repeat  __________ times. Complete this stretch __________ times per day. STRETCHING - Hip Flexors, Lunge Half kneel with your knee on the floor and your opposite knee bent and directly over your ankle.  Keep good posture with your head over your shoulders. Tighten your buttocks to point your tailbone downward; this will prevent your back from arching too much.  You should feel a gentle stretch in the front of your thigh and/or hip. If you do not feel any resistance, slightly slide your opposite foot forward and then slowly lunge forward so your knee once again lines up over your ankle. Be sure your tailbone remains pointed downward.  Hold this stretch for __________ seconds.  Repeat __________ times. Complete this stretch __________ times per day. STRETCH - Adductors, Lunge  While standing, spread your legs.  Lean away from your injured leg by bending your opposite knee. You may rest your hands on your thigh for balance.  You should feel a stretch in your inner thigh. Hold for __________ seconds.  Repeat __________ times. Complete this exercise __________ times per day. Document Released: 02/03/2004 Document Revised: 05/12/2013 Document Reviewed: 04/09/2008 [facility] Patient Information 2015 [place], [place]. This information is not intended to replace advice given to you by your health care provider. Make sure you discuss any questions you have with your health care provider.
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
## 2013-11-08 NOTE — ED Provider Notes (Signed)
Medical screening examination/treatment/procedure(s) were performed by a resident physician or non-physician practitioner and as the supervising physician I was immediately available for consultation/collaboration.  Linna Darner, MD Family Medicine   Waldemar Dickens, MD 11/08/13 [phone]

## 2014-12-15 ENCOUNTER — Encounter (HOSPITAL_COMMUNITY): Payer: Self-pay | Admitting: Emergency Medicine

## 2014-12-15 ENCOUNTER — Emergency Department (HOSPITAL_COMMUNITY)
Admission: EM | Admit: 2014-12-15 | Discharge: 2014-12-15 | Disposition: A | Discharge status: Nursing Home (Includes ICF) | Payer: Medicaid Other | Source: Home / Self Care | Attending: Emergency Medicine | Admitting: Emergency Medicine

## 2014-12-15 ENCOUNTER — Emergency Department (HOSPITAL_COMMUNITY): Payer: Medicaid Other

## 2014-12-15 ENCOUNTER — Emergency Department (HOSPITAL_COMMUNITY)
Admission: EM | Admit: 2014-12-15 | Discharge: 2014-12-15 | Discharge status: Against Medical Advice | Payer: Medicaid Other | Attending: Emergency Medicine | Admitting: Emergency Medicine

## 2014-12-15 ENCOUNTER — Encounter (HOSPITAL_COMMUNITY): Payer: Self-pay

## 2014-12-15 DIAGNOSIS — R079 Chest pain, unspecified: Secondary | ICD-10-CM

## 2014-12-15 DIAGNOSIS — H409 Unspecified glaucoma: Secondary | ICD-10-CM | POA: Insufficient documentation

## 2014-12-15 DIAGNOSIS — E119 Type 2 diabetes mellitus without complications: Secondary | ICD-10-CM

## 2014-12-15 DIAGNOSIS — I509 Heart failure, unspecified: Secondary | ICD-10-CM | POA: Diagnosis not present

## 2014-12-15 DIAGNOSIS — Z91040 Latex allergy status: Secondary | ICD-10-CM | POA: Insufficient documentation

## 2014-12-15 DIAGNOSIS — Z7982 Long term (current) use of aspirin: Secondary | ICD-10-CM | POA: Insufficient documentation

## 2014-12-15 DIAGNOSIS — Z720 Tobacco use: Secondary | ICD-10-CM | POA: Diagnosis not present

## 2014-12-15 DIAGNOSIS — R110 Nausea: Secondary | ICD-10-CM | POA: Insufficient documentation

## 2014-12-15 DIAGNOSIS — R42 Dizziness and giddiness: Secondary | ICD-10-CM | POA: Diagnosis not present

## 2014-12-15 DIAGNOSIS — I10 Essential (primary) hypertension: Secondary | ICD-10-CM | POA: Diagnosis not present

## 2014-12-15 DIAGNOSIS — M1990 Unspecified osteoarthritis, unspecified site: Secondary | ICD-10-CM | POA: Diagnosis not present

## 2014-12-15 DIAGNOSIS — F17210 Nicotine dependence, cigarettes, uncomplicated: Secondary | ICD-10-CM | POA: Diagnosis not present

## 2014-12-15 DIAGNOSIS — D849 Immunodeficiency, unspecified: Secondary | ICD-10-CM | POA: Diagnosis not present

## 2014-12-15 DIAGNOSIS — Z79899 Other long term (current) drug therapy: Secondary | ICD-10-CM | POA: Insufficient documentation

## 2014-12-15 DIAGNOSIS — R9431 Abnormal electrocardiogram [ECG] [EKG]: Secondary | ICD-10-CM

## 2014-12-15 DIAGNOSIS — Z7984 Long term (current) use of oral hypoglycemic drugs: Secondary | ICD-10-CM | POA: Insufficient documentation

## 2014-12-15 DIAGNOSIS — R61 Generalized hyperhidrosis: Secondary | ICD-10-CM | POA: Diagnosis not present

## 2014-12-15 HISTORY — DX: Heart failure, unspecified: I50.9

## 2014-12-15 HISTORY — DX: Unspecified osteoarthritis, unspecified site: M19.90

## 2014-12-15 HISTORY — DX: Unspecified glaucoma: H40.9

## 2014-12-15 LAB — CBC
HCT: 41.7 % (ref 36.0–46.0)
HEMOGLOBIN: 13.3 g/dL (ref 12.0–15.0)
MCH: 29.8 pg (ref 26.0–34.0)
MCHC: 31.9 g/dL (ref 30.0–36.0)
MCV: 93.5 fL (ref 78.0–100.0)
Platelets: 235 10*3/uL (ref 150–400)
RBC: 4.46 MIL/uL (ref 3.87–5.11)
RDW: 15.3 % (ref 11.5–15.5)
WBC: 8.2 10*3/uL (ref 4.0–10.5)

## 2014-12-15 LAB — I-STAT TROPONIN, ED: TROPONIN I, POC: 0 ng/mL (ref 0.00–0.08)

## 2014-12-15 LAB — BASIC METABOLIC PANEL
ANION GAP: 10 (ref 5–15)
BUN: 10 mg/dL (ref 6–20)
CHLORIDE: 104 mmol/L (ref 101–111)
CO2: 29 mmol/L (ref 22–32)
Calcium: 9.2 mg/dL (ref 8.9–10.3)
Creatinine, Ser: 0.65 mg/dL (ref 0.44–1.00)
GFR calc non Af Amer: >60 mL/min (ref >60)
Glucose, Bld: 134 mg/dL — ABNORMAL HIGH (ref 65–99)
POTASSIUM: 3.8 mmol/L (ref 3.5–5.1)
SODIUM: 143 mmol/L (ref 135–145)

## 2014-12-15 MED ORDER — GI COCKTAIL ~~LOC~~
30.0000 mL | Freq: Once | ORAL | Status: DC
  Filled 2014-12-15: qty 30

## 2014-12-15 MED ORDER — MORPHINE SULFATE (PF) 4 MG/ML IV SOLN: 4.0000 mg | Freq: Once | INTRAVENOUS | Status: DC

## 2014-12-15 MED ORDER — NITROGLYCERIN 0.4 MG SL SUBL: 0.4000 mg | SUBLINGUAL_TABLET | SUBLINGUAL | Status: DC | PRN

## 2014-12-15 MED ORDER — SODIUM CHLORIDE 0.9 % IV SOLN
Freq: Once | INTRAVENOUS | Status: AC
  Administered 2014-12-15: 15:00:00 via INTRAVENOUS

## 2014-12-15 MED ORDER — ASPIRIN 81 MG PO CHEW
324.0000 mg | CHEWABLE_TABLET | Freq: Once | ORAL | Status: AC
  Administered 2014-12-15: 324 mg via ORAL
  Filled 2014-12-15: qty 4

## 2014-12-15 NOTE — ED Notes (Signed)
Notified Camprubi-soms, PA pt refused GI cocktail and stated she would not stay for admission if that was needed.

## 2014-12-15 NOTE — ED Notes (Signed)
[facility] has been called.  

## 2014-12-15 NOTE — ED Notes (Signed)
Pt from UC via [facility] with c/o chest pressure starting yesterday morning.  Pt reports nausea and diaphoresis at onset but has since subsided.  Given 2 nitro with no change in pain.  Pt in NAD, A&O.

## 2014-12-15 NOTE — ED Notes (Signed)
Pt called out stating she was ready to leave.  Notified Camprubi-soms, [place].

## 2014-12-15 NOTE — ED Notes (Signed)
Patient transported to X-ray 

## 2014-12-15 NOTE — Discharge Instructions (Signed)
You have left against medical advice. Your work up has not been completed. You will need to follow up with your regular doctor tomorrow for ongoing management of your symptoms. Return to the ER for changes or worsening symptoms.   Nonspecific Chest Pain It is often hard to find the cause of chest pain. There is always a chance that your pain could be related to something serious, such as a heart attack or a blood clot in your lungs. Chest pain can also be caused by conditions that are not life-threatening. If you have chest pain, it is very important to follow up with your doctor.  HOME CARE  If you were prescribed an antibiotic medicine, finish it all even if you start to feel better.  Avoid any activities that cause chest pain.  Do not use any tobacco products, including cigarettes, chewing tobacco, or electronic cigarettes. If you need help quitting, ask your doctor.  Do not drink alcohol.  Take medicines only as told by your doctor.  Keep all follow-up visits as told by your doctor. This is important. This includes any further testing if your chest pain does not go away.  Your doctor may tell you to keep your head raised (elevated) while you sleep.  Make lifestyle changes as told by your doctor. These may include:  Getting regular exercise. Ask your doctor to suggest some activities that are safe for you.  Eating a heart-healthy diet. Your doctor or a diet specialist (dietitian) can help you to learn healthy eating options.  Maintaining a healthy weight.  Managing diabetes, if necessary.  Reducing stress. GET HELP IF:  Your chest pain does not go away, even after treatment.  You have a rash with blisters on your chest.  You have a fever. GET HELP RIGHT AWAY IF:  Your chest pain is worse.  You have an increasing cough, or you cough up blood.  You have severe belly (abdominal) pain.  You feel extremely weak.  You pass out (faint).  You have chills.  You have  sudden, unexplained chest discomfort.  You have sudden, unexplained discomfort in your arms, back, neck, or jaw.  You have shortness of breath at any time.  You suddenly start to sweat, or your skin gets clammy.  You feel nauseous.  You vomit.  You suddenly feel light-headed or dizzy.  Your heart begins to beat quickly, or it feels like it is skipping beats. These symptoms may be an emergency. Do not wait to see if the symptoms will go away. Get medical help right away. Call your local emergency services (911 in the [place] Do not drive yourself to the hospital.   This information is not intended to replace advice given to you by your health care provider. Make sure you discuss any questions you have with your health care provider.   Document Released: 06/14/2007 Document Revised: 01/16/2014 Document Reviewed: 08/01/2013 Elsevier Interactive Patient Education [employer].

## 2014-12-15 NOTE — ED Provider Notes (Signed)
CSN: [id number]     Arrival date & time 12/15/14  1351 History   First MD Initiated Contact with Patient 12/15/14 1409     Chief Complaint  Patient presents with  . Chest Pain   (Consider location/radiation/quality/duration/timing/severity/associated sxs/prior Treatment) HPI Comments: 64 year old female presents to the urgent care after having an episode of chest heaviness and pressing feeling on the left and mid anterior chest yesterday. She states that this was "bad pain" yesterday. He was associated with an episode of diaphoresis and nausea without vomiting. It occurred a second time yesterday evening and once again this morning. She has had "mild shortness of breath". At this time she has chest "slight" discomfort in the chest. She has risk factors of type 2 diabetes mellitus, hypertension, CHF and a smoking history often known for greater than 20 years. She carries a strong odor of cigarette smoke with her. She is currently in no acute distress. She is talkative, relaxed posturing and showing no signs of distress.   Past Medical History  Diagnosis Date  . Diabetes mellitus   . Hypertension   . CHF (congestive heart failure) [facility])    Past Surgical History  Procedure Laterality Date  . Cesarean section     History reviewed. No pertinent family history. Social History  Substance Use Topics  . Smoking status: Current Every Day Smoker  . Smokeless tobacco: None  . Alcohol Use: Yes     Comment: rarely   OB History    No data available     Review of Systems  Constitutional: Positive for activity change. Negative for fever.  HENT: Negative.   Respiratory: Positive for shortness of breath.   Cardiovascular: Positive for chest pain. Negative for palpitations.       Bilateral ankle edema, chronic.  Gastrointestinal: Positive for nausea. Negative for vomiting and abdominal pain.  Genitourinary: Negative.   Musculoskeletal: Negative for myalgias, back pain and joint swelling.   Skin: Negative.   Neurological: Negative for dizziness, speech difficulty and light-headedness.    Allergies  Latex  Home Medications   Prior to Admission medications   Medication Sig Start Date End Date Taking? Authorizing Provider  aspirin 81 MG tablet Take 81 mg by mouth daily.    Historical Provider, MD  benazepril-hydrochlorthiazide (LOTENSIN HCT) 20-12.5 MG per tablet Take 1 tablet by mouth 2 (two) times daily. 12/19/11   Billy Fischer, MD  cyclobenzaprine (FLEXERIL) 10 MG tablet Take 0.5 tablets (5 mg total) by mouth 3 (three) times daily as needed for muscle spasms. 10/20/13   Mercedes Camprubi-Soms, PA-C  glimepiride (AMARYL) 4 MG tablet Take 4 mg by mouth 2 (two) times daily.     Historical Provider, MD  HYDROcodone-acetaminophen (NORCO) 5-325 MG per tablet Take 1 tablet by mouth every 6 (six) hours as needed for severe pain. 10/20/13   Mercedes Camprubi-Soms, PA-C  latanoprost (XALATAN) 0.005 % ophthalmic solution Place 1 drop into both eyes at bedtime.    Historical Provider, MD  naproxen (NAPROSYN) 500 MG tablet Take 1 tablet (500 mg total) by mouth 2 (two) times daily as needed for mild pain, moderate pain or headache (TAKE WITH MEALS.). 10/20/13   Mercedes Camprubi-Soms, PA-C  PredniSONE 10 MG KIT 6 day dose pack po 10/30/13   Gregor Hams, MD  traMADol (ULTRAM) 50 MG tablet Take 1 tablet (50 mg total) by mouth every 6 (six) hours as needed. 10/23/13   Liam Graham, PA-C   Meds Ordered and Administered this Visit  Medications  0.9 %  sodium chloride infusion (not administered)    BP 125/79 mmHg  Pulse 97  Temp(Src) 98.1 F (36.7 C) (Oral)  SpO2 100% No data found.   Physical Exam  Constitutional: She appears well-developed and well-nourished. No distress.  Eyes: EOM are normal.  Neck: Normal range of motion. Neck supple.  Cardiovascular: Normal rate, regular rhythm and normal heart sounds.   Pulmonary/Chest: Effort normal.  Breath sounds generally  diminished especially with expiration. No wheezing or crackles.  Abdominal: Soft. There is no tenderness.  Musculoskeletal: She exhibits edema.  1+ nonpitting edema to the bilateral ankles.  Neurological: She is alert. She exhibits normal muscle tone.  Skin: Skin is warm and dry. No rash noted. No erythema.  Psychiatric: She has a normal mood and affect.  Nursing note and vitals reviewed.   ED Course  Procedures (including critical care time)  Labs Review Labs Reviewed - No data to display  Imaging Review No results found. ED ECG REPORT   Date: 12/15/2014  Rate: 89  Rhythm: normal sinus rhythm  QRS Axis: normal  Intervals: normal  ST/T Wave abnormalities: 24m S-T elevation V and V2, Q wave V1,V2,V3.  Conduction Disutrbances:none  Narrative Interpretation:   Old EKG Reviewed: none available  I have personally reviewed the EKG tracing and agree with the computerized printout as noted.   Visual Acuity Review  Right Eye Distance:   Left Eye Distance:   Bilateral Distance:    Right Eye Near:   Left Eye Near:    Bilateral Near:         MDM   1. Chest pain, unspecified chest pain type   2. Tobacco abuse disorder   3. Type 2 diabetes mellitus without complication, without long-term current use of insulin ([place]   4. Essential hypertension   5. Abnormal EKG    Transfer to the ED via [facility] with IV, O2 Stable    DJanne Napoleon NP 12/15/14 1442

## 2014-12-15 NOTE — ED Notes (Signed)
Pt stated she removed her own IV.  Had sign AMA.  Pt ambulated to lobby with ease.

## 2014-12-15 NOTE — ED Notes (Signed)
C/o pressure in chest that comes and goes for past few days . Was here yesterday, but had to leave w her ride. C/o last night she was nauseated, but did not vomit, c/o some trouble getting a good breath. Marland Kitchen NAD at present, w/d/color good. States discomfort under left breast

## 2014-12-15 NOTE — ED Provider Notes (Signed)
CSN: [id number]     Arrival date & time 12/15/14  1514 History   First MD Initiated Contact with Patient 12/15/14 1542     Chief Complaint  Patient presents with  . Chest Pain     (Consider location/radiation/quality/duration/timing/severity/associated sxs/prior Treatment) HPI Comments: Rachel Wilcox is a 64 y.o. female with a PMHx of DM2, HTN, CHF, and tobacco use, who presents to the ED with complaints of chest pain. Patient reports that initially her chest pain began yesterday around 6 AM when she was awakening from sleep, while at rest. She reports the pain was initially worse, and has improved today but she sought medical attention at the urgent care and the subsequently sent here for further evaluation. Patient states the pain is currently 7/10 intermittent heaviness/pressure like pain in the central/epigastric area of her chest, nonradiating, with no known aggravating factors, and improved somewhat with rest and nitroglycerin given in route. Associated symptoms include nausea, diaphoresis, and lightheadedness. She states that she had shortness breath yesterday but this is completely resolved today. She reports that the symptoms have been intermittent throughout all of yesterday and into today.  She denies any fevers, chills, shortness of breath, cough, new leg swelling (chronic baseline ankle swelling), recent travel/surgery/immobilization, estrogen use, history of DVT/PE, abdominal pain, vomiting, diarrhea, constipation, dysuria, hematuria, numbness, tingling, weakness, claudication, or orthopnea. She is a smoker. She has a family history of MI in her mother.  Patient is a 64 y.o. female presenting with chest pain. The history is provided by the patient and medical records. No language interpreter was used.  Chest Pain Pain location:  Substernal area Pain quality: pressure   Pain radiates to:  Does not radiate Pain radiates to the back: no   Pain severity:  Moderate Onset quality:   Gradual Duration:  1 day Timing:  Intermittent Progression:  Unchanged Chronicity:  New Context: at rest   Relieved by:  Nitroglycerin and rest Worsened by:  Nothing tried Ineffective treatments:  None tried Associated symptoms: diaphoresis and nausea   Associated symptoms: no abdominal pain, no claudication, no cough, no fever, no heartburn, no lower extremity edema, no numbness, no orthopnea, no shortness of breath, not vomiting and no weakness   Risk factors: diabetes mellitus, hypertension, obesity and smoking   Risk factors: no birth control, no immobilization, no prior DVT/PE and no surgery     Past Medical History  Diagnosis Date  . Diabetes mellitus   . Hypertension   . CHF (congestive heart failure) ([place])   . Arthritis   . Glaucoma    Past Surgical History  Procedure Laterality Date  . Cesarean section    . Wisdom tooth extraction     History reviewed. No pertinent family history. Social History  Substance Use Topics  . Smoking status: Current Some Day Smoker -- 0.50 packs/day    Types: Cigarettes  . Smokeless tobacco: Never Used  . Alcohol Use: Yes     Comment: rarely   OB History    No data available     Review of Systems  Constitutional: Positive for diaphoresis. Negative for fever and chills.  Respiratory: Negative for cough and shortness of breath.   Cardiovascular: Positive for chest pain. Negative for orthopnea, claudication and leg swelling.  Gastrointestinal: Positive for nausea. Negative for heartburn, vomiting, abdominal pain, diarrhea and constipation.  Genitourinary: Negative for dysuria and hematuria.  Musculoskeletal: Negative for myalgias and arthralgias.  Skin: Negative for color change.  Allergic/Immunologic: Positive for immunocompromised state (diabetic).  Neurological: Positive for light-headedness. Negative for weakness and numbness.  Psychiatric/Behavioral: Negative for confusion.   10 Systems reviewed and are negative for acute  change except as noted in the HPI.    Allergies  Latex  Home Medications   Prior to Admission medications   Medication Sig Start Date End Date Taking? Authorizing Provider  aspirin 81 MG tablet Take 81 mg by mouth daily.    Historical Provider, MD  benazepril-hydrochlorthiazide (LOTENSIN HCT) 20-12.5 MG per tablet Take 1 tablet by mouth 2 (two) times daily. 12/19/11   Billy Fischer, MD  cyclobenzaprine (FLEXERIL) 10 MG tablet Take 0.5 tablets (5 mg total) by mouth 3 (three) times daily as needed for muscle spasms. 10/20/13   Jhovani Griswold Camprubi-Soms, PA-C  glimepiride (AMARYL) 4 MG tablet Take 4 mg by mouth 2 (two) times daily.     Historical Provider, MD  HYDROcodone-acetaminophen (NORCO) 5-325 MG per tablet Take 1 tablet by mouth every 6 (six) hours as needed for severe pain. 10/20/13   Trea Latner Camprubi-Soms, PA-C  latanoprost (XALATAN) 0.005 % ophthalmic solution Place 1 drop into both eyes at bedtime.    Historical Provider, MD  naproxen (NAPROSYN) 500 MG tablet Take 1 tablet (500 mg total) by mouth 2 (two) times daily as needed for mild pain, moderate pain or headache (TAKE WITH MEALS.). 10/20/13   Jshaun Abernathy Camprubi-Soms, PA-C  PredniSONE 10 MG KIT 6 day dose pack po 10/30/13   Gregor Hams, MD  traMADol (ULTRAM) 50 MG tablet Take 1 tablet (50 mg total) by mouth every 6 (six) hours as needed. 10/23/13   Freeman Caldron Baker, PA-C   BP 140/72 mmHg  Pulse 85  Temp(Src) 98 F (36.7 C) (Oral)  Resp 11  SpO2 96% Physical Exam  Constitutional: She is oriented to person, place, and time. Vital signs are normal. She appears well-developed and well-nourished.  Non-toxic appearance. No distress.  Afebrile, nontoxic, NAD  HENT:  Head: Normocephalic and atraumatic.  Mouth/Throat: Oropharynx is clear and moist and mucous membranes are normal.  Eyes: Conjunctivae and EOM are normal. Right eye exhibits no discharge. Left eye exhibits no discharge.  Neck: Normal range of motion. Neck supple.    Cardiovascular: Normal rate, regular rhythm, normal heart sounds and intact distal pulses.  Exam reveals no gallop and no friction rub.   No murmur heard. RRR, nl s1/s2, no m/r/g, distal pulses intact, trace b/l nonpitting pedal edema   Pulmonary/Chest: Effort normal and breath sounds normal. No respiratory distress. She has no decreased breath sounds. She has no wheezes. She has no rhonchi. She has no rales. She exhibits tenderness. She exhibits no crepitus, no deformity and no retraction.    CTAB in all lung fields, no w/r/r, no hypoxia or increased WOB, speaking in full sentences, SpO2 96% on RA  Chest wall mildly TTP anteriorly, no crepitus or retractions, no deformities  Abdominal: Soft. Normal appearance and bowel sounds are normal. She exhibits no distension. There is no tenderness. There is no rigidity, no rebound, no guarding, no CVA tenderness, no tenderness at McBurney's point and negative Murphy's sign.  Musculoskeletal: Normal range of motion.  MAE x4 Strength and sensation grossly intact Distal pulses intact Trace b/l nonpitting pedal edema (baseline), neg homan's bilaterally   Neurological: She is alert and oriented to person, place, and time. She has normal strength. No sensory deficit.  Skin: Skin is warm, dry and intact. No rash noted.  Psychiatric: She has a normal mood and affect.  Nursing note and  vitals reviewed.   ED Course  Procedures (including critical care time) Labs Review Labs Reviewed  BASIC METABOLIC PANEL - Abnormal; Notable for the following:    Glucose, Bld 134 (*)    All other components within normal limits  CBC  I-STAT TROPOININ, ED    Imaging Review Dg Chest 2 View  12/15/2014  CLINICAL DATA:  Left side chest pain starting 2 days EXAM: CHEST  2 VIEW COMPARISON:  01/03/2011 FINDINGS: Cardiomediastinal silhouette is stable. Mild hyperinflation. Stable chronic mild interstitial prominence. No acute infiltrate or pleural effusion. No pulmonary  edema. Mild degenerative changes thoracic spine. IMPRESSION: No active cardiopulmonary disease. Stable hyperinflation and chronic mild interstitial prominence. Electronically Signed   By: Lahoma Crocker M.D.   On: 12/15/2014 16:06     Echo 08/28/06: SUMMARY - Overall left ventricular systolic function was normal. Left    ventricular ejection fraction was estimated to be 55 %. This    study was inadequate for the evaluation of left ventricular    regional wall motion. Left ventricular wall thickness was    mildly increased. - There was trivial aortic valvular regurgitation. - There was a left pleural effusion.  I have personally reviewed and evaluated these images and lab results as part of my medical decision-making.   EKG Interpretation   Date/Time:  Tuesday December 15 2014 15:23:04 EST Ventricular Rate:  73 PR Interval:  177 QRS Duration: 88 QT Interval:  411 QTC Calculation: 453 R Axis:   16 Text Interpretation:  Sinus rhythm Low voltage, precordial leads Probable  anteroseptal infarct, old Baseline wander in lead(s) V1 V6 No significant  change since last tracing Confirmed by FLOYD MD, Quillian Quince ([id number]) on  12/15/2014 5:54:26 PM      MDM   Final diagnoses:  Chest pain, unspecified chest pain type  Nausea  Diaphoresis    64 y.o. female here with chest pressure intermittently since yesterday, associated with nausea, diaphoresis, and lightheadedness. She went to urgent care and was sent here further evaluation. In route she got nitroglycerin which has helped some. Will obtain labs, chest x-ray, EKG, and give GI cocktail as well as morphine and more nitroglycerin as needed for continuing pain. Doubt PE, she denies shortness breath to me, no tachycardia or hypoxia, no unilateral leg swelling. Doubt dissection. Discussed that given the story and concern, she would benefit from ACS r/o, but she adamantly refused. Discussed 3hr troponin instead which she agreed to.  Will reassess shortly.  5:55 PM EKG unchanged from prior, trop neg, CBC and BMP WNL. CXR with chronic interstitial prominence but no acute findings. Pt dressed and calling out to leave, does not want to stay for 3hr troponin, discussed at length that she will be leaving AMA and the risks that exist including possible missed MI leading to death, etc. She still wants to leave. AMA paperwork signed. Pt instructed to f/up with her PCP tomorrow given her concerning story and need for ongoing eval. Pt discharged AMA.  BP 109/63 mmHg  Pulse 86  Temp(Src) 98 F (36.7 C) (Oral)  Resp 18  SpO2 98%  Meds ordered this encounter  Medications  . aspirin chewable tablet 324 mg    Sig:   . nitroGLYCERIN (NITROSTAT) SL tablet 0.4 mg    Sig:   . gi cocktail (Maalox,Lidocaine,Donnatal)    Sig:   . morphine 4 MG/ML injection 4 mg    Sig:      Taydon Nasworthy Camprubi-Soms, PA-C 12/15/14 [place], DO 12/15/14  2157 

## 2014-12-15 NOTE — ED Notes (Signed)
Pt called out requesting to leave.  Pt noted to be dressed and standing at the doorway.  Notified Camprubi-soms.

## 2015-01-27 ENCOUNTER — Other Ambulatory Visit: Payer: Self-pay

## 2015-01-27 DIAGNOSIS — Z1231 Encounter for screening mammogram for malignant neoplasm of breast: Secondary | ICD-10-CM

## 2015-10-06 ENCOUNTER — Other Ambulatory Visit: Payer: Self-pay | Admitting: Internal Medicine

## 2015-10-06 DIAGNOSIS — R5381 Other malaise: Secondary | ICD-10-CM

## 2015-10-07 ENCOUNTER — Other Ambulatory Visit: Payer: Self-pay | Admitting: Internal Medicine

## 2015-10-08 ENCOUNTER — Other Ambulatory Visit: Payer: Self-pay | Admitting: Internal Medicine

## 2015-10-08 DIAGNOSIS — N63 Unspecified lump in unspecified breast: Secondary | ICD-10-CM

## 2015-10-19 ENCOUNTER — Other Ambulatory Visit (HOSPITAL_COMMUNITY): Payer: Self-pay | Admitting: Internal Medicine

## 2015-10-19 ENCOUNTER — Ambulatory Visit (HOSPITAL_COMMUNITY)
Admission: RE | Admit: 2015-10-19 | Discharge: 2015-10-19 | Disposition: A | Discharge status: Home / Self Care | Payer: Medicaid Other | Source: Ambulatory Visit | Attending: Internal Medicine | Admitting: Internal Medicine

## 2015-10-19 DIAGNOSIS — M7732 Calcaneal spur, left foot: Secondary | ICD-10-CM | POA: Insufficient documentation

## 2015-10-19 DIAGNOSIS — M79672 Pain in left foot: Secondary | ICD-10-CM

## 2015-11-02 ENCOUNTER — Other Ambulatory Visit: Payer: Self-pay | Admitting: Internal Medicine

## 2015-11-11 ENCOUNTER — Other Ambulatory Visit: Payer: Medicaid Other

## 2015-11-11 ENCOUNTER — Ambulatory Visit
Admission: RE | Admit: 2015-11-11 | Discharge: 2015-11-11 | Disposition: A | Discharge status: Home / Self Care | Payer: Medicaid Other | Source: Ambulatory Visit | Attending: Internal Medicine | Admitting: Internal Medicine

## 2015-11-11 DIAGNOSIS — N63 Unspecified lump in unspecified breast: Secondary | ICD-10-CM

## 2015-11-25 ENCOUNTER — Ambulatory Visit
Admission: RE | Admit: 2015-11-25 | Discharge: 2015-11-25 | Disposition: A | Discharge status: Home / Self Care | Payer: Medicaid Other | Source: Ambulatory Visit | Attending: Internal Medicine | Admitting: Internal Medicine

## 2015-11-25 DIAGNOSIS — N63 Unspecified lump in unspecified breast: Secondary | ICD-10-CM

## 2016-05-13 ENCOUNTER — Emergency Department (HOSPITAL_COMMUNITY)
Admission: EM | Admit: 2016-05-13 | Discharge: 2016-05-13 | Disposition: A | Discharge status: Home / Self Care | Payer: Medicaid Other | Attending: Emergency Medicine | Admitting: Emergency Medicine

## 2016-05-13 ENCOUNTER — Encounter (HOSPITAL_COMMUNITY): Payer: Self-pay | Admitting: Emergency Medicine

## 2016-05-13 ENCOUNTER — Emergency Department (HOSPITAL_COMMUNITY): Payer: Medicaid Other

## 2016-05-13 DIAGNOSIS — E119 Type 2 diabetes mellitus without complications: Secondary | ICD-10-CM | POA: Diagnosis not present

## 2016-05-13 DIAGNOSIS — Z7984 Long term (current) use of oral hypoglycemic drugs: Secondary | ICD-10-CM | POA: Insufficient documentation

## 2016-05-13 DIAGNOSIS — I110 Hypertensive heart disease with heart failure: Secondary | ICD-10-CM | POA: Insufficient documentation

## 2016-05-13 DIAGNOSIS — I509 Heart failure, unspecified: Secondary | ICD-10-CM | POA: Diagnosis not present

## 2016-05-13 DIAGNOSIS — Z7982 Long term (current) use of aspirin: Secondary | ICD-10-CM | POA: Insufficient documentation

## 2016-05-13 DIAGNOSIS — Z91040 Latex allergy status: Secondary | ICD-10-CM | POA: Insufficient documentation

## 2016-05-13 DIAGNOSIS — M25511 Pain in right shoulder: Secondary | ICD-10-CM | POA: Insufficient documentation

## 2016-05-13 DIAGNOSIS — F17210 Nicotine dependence, cigarettes, uncomplicated: Secondary | ICD-10-CM | POA: Insufficient documentation

## 2016-05-13 MED ORDER — METHOCARBAMOL 500 MG PO TABS: 500.0000 mg | ORAL_TABLET | Freq: Two times a day (BID) | ORAL | 0 refills | Status: DC

## 2016-05-13 MED ORDER — MELOXICAM 7.5 MG PO TABS: 7.5000 mg | ORAL_TABLET | Freq: Every day | ORAL | 0 refills | Status: DC

## 2016-05-13 MED ORDER — METHOCARBAMOL 500 MG PO TABS
500.0000 mg | ORAL_TABLET | Freq: Once | ORAL | Status: AC
  Administered 2016-05-13: 500 mg via ORAL
  Filled 2016-05-13: qty 1

## 2016-05-13 MED ORDER — MELOXICAM 7.5 MG PO TABS
7.5000 mg | ORAL_TABLET | Freq: Once | ORAL | Status: AC
Start: 2016-05-13 — End: 2016-05-13
  Administered 2016-05-13: 7.5 mg via ORAL
  Filled 2016-05-13: qty 1

## 2016-05-13 NOTE — ED Notes (Signed)
Bed: WTR7 Expected date:  Expected time:  Means of arrival:  Comments: 66 yo shoulder pain- no injury

## 2016-05-13 NOTE — ED Triage Notes (Signed)
Per EMS pt from home c/o right shoulder pain r/t no injury. Pt does report she was standing on her porch and fell onto right side about 6 weeks ago and is unsure if this is related. Pt ambulatory.

## 2016-05-13 NOTE — ED Notes (Signed)
Patient transported to X-ray 

## 2016-05-13 NOTE — Discharge Instructions (Signed)
Take Mobic and Robaxin as direct. Follow-up with orthopedics listed below. Schedule appointment as soon as possible for further evaluation. Return to ED for worsening symptoms, injury, numbness, fever, trouble breathing or trouble walking.

## 2016-05-13 NOTE — ED Provider Notes (Signed)
[facility] DEPT Provider Note   CSN: [id number] Arrival date & time: 05/13/16  1417   By signing my name below, I, Avnee Patel, attest that this documentation has been prepared under the direction and in the presence of   Copper Basnett, PA-C . Electronically Signed: Delton [place], ED Scribe. 05/13/16. 3:15 PM.   History   Chief Complaint Chief Complaint  Patient presents with  . Shoulder Pain    HPI Comments:  ADIYA SELMER is a 66 y.o. female, with a PMHx of arthritis, CHF, HTN and DM, who presents to the Emergency Department complaining of gradual onset, gradually worsening, moderate right shoulder pain beginning 3 days ago. Her pain is worse with movement of her right upper extremity and upon palpation. Pt notes she fell on her right side about 6 weeks ago but notes she did not feel pain at that time and is unsure if this is related to the fall. She has taken Tramadol with mild relief. Pt denies weakness, numbness, elbow pain, recent back pain or any other associate symptoms. She also denies a hx of similar symptoms or any injuries to her right shoulder. No other complaints noted at this time.   The history is provided by the patient. No language interpreter was used.    Past Medical History:  Diagnosis Date  . Arthritis   . CHF (congestive heart failure) ([place])   . Diabetes mellitus   . Glaucoma   . Hypertension     Patient Active Problem List   Diagnosis Date Noted  . DIABETES MELLITUS 09/09/2008  . HYPOKALEMIA, HX OF 09/09/2008  . TOBACCO ABUSE, HX OF 09/09/2008  . CHEST PAIN, ATYPICAL, HX OF 09/09/2008  . BREAST BIOPSY, HX OF 09/09/2008  . BREAST MASS, RIGHT 06/11/2008  . OBESITY NOS 09/06/2006  . ANXIETY 09/06/2006  . TOBACCO USER 09/06/2006  . DEPRESSION 09/06/2006  . HYPERTENSION 09/06/2006  . GERD 09/06/2006  . ECZEMA 09/06/2006  . DEGENERATIVE JOINT DISEASE 09/06/2006  . HEADACHE 09/06/2006    Past Surgical History:  Procedure Laterality Date  .  CESAREAN SECTION    . WISDOM TOOTH EXTRACTION      OB History    No data available       Home Medications    Prior to Admission medications   Medication Sig Start Date End Date Taking? Authorizing Provider  aspirin 81 MG tablet Take 81 mg by mouth daily.    [provider]  benazepril-hydrochlorthiazide (LOTENSIN HCT) 20-12.5 MG per tablet Take 1 tablet by mouth 2 (two) times daily. 12/19/11   Billy Fischer, MD  cyclobenzaprine (FLEXERIL) 10 MG tablet Take 0.5 tablets (5 mg total) by mouth 3 (three) times daily as needed for muscle spasms. 10/20/13   Street, Mercedes, PA-C  glimepiride (AMARYL) 4 MG tablet Take 4 mg by mouth 2 (two) times daily.     [provider]  HYDROcodone-acetaminophen (NORCO) 5-325 MG per tablet Take 1 tablet by mouth every 6 (six) hours as needed for severe pain. 10/20/13   Street, Mercedes, PA-C  ibuprofen (ADVIL,MOTRIN) 800 MG tablet Take 800 mg by mouth every 12 (twelve) hours as needed. for pain 12/08/14   [provider]  latanoprost (XALATAN) 0.005 % ophthalmic solution Place 1 drop into both eyes at bedtime.    [provider]  meloxicam (MOBIC) 7.5 MG tablet Take 1 tablet (7.5 mg total) by mouth daily. 05/13/16   [place], PA-C  methocarbamol (ROBAXIN) 500 MG tablet Take 1 tablet (500  mg total) by mouth 2 (two) times daily. 05/13/16   Melroy Bougher, PA-C  naproxen (NAPROSYN) 500 MG tablet Take 1 tablet (500 mg total) by mouth 2 (two) times daily as needed for mild pain, moderate pain or headache (TAKE WITH MEALS.). 10/20/13   Street, [place], PA-C  PredniSONE 10 MG KIT 6 day dose pack po 10/30/13   Gregor Hams, MD  traMADol (ULTRAM) 50 MG tablet Take 1 tablet (50 mg total) by mouth every 6 (six) hours as needed. 10/23/13   Liam Graham, PA-C    Family History No family history on file.  Social History Social History  Substance Use Topics  . Smoking status: Current Some Day Smoker    Packs/day: 0.50     Types: Cigarettes  . Smokeless tobacco: Never Used  . Alcohol use Yes     Comment: rarely     Allergies   Latex   Review of Systems Review of Systems  Musculoskeletal: Positive for myalgias. Negative for back pain.  Neurological: Negative for weakness and numbness.   Physical Exam Updated Vital Signs BP (!) 160/84 (BP Location: Left Arm)   Pulse 84   Temp 98.1 F (36.7 C) (Oral)   Resp 18   SpO2 97%   Physical Exam  Constitutional: She appears well-developed and well-nourished. No distress.  HENT:  Head: Normocephalic and atraumatic.  Nose: Nose normal.  Eyes: Conjunctivae and EOM are normal. Right eye exhibits no discharge. Left eye exhibits no discharge. No scleral icterus.  Neck: Normal range of motion. Neck supple.  Cardiovascular: Normal rate, regular rhythm, normal heart sounds and intact distal pulses.  Exam reveals no gallop and no friction rub.   No murmur heard. Pulmonary/Chest: Effort normal and breath sounds normal. No respiratory distress.  Abdominal: Soft. Bowel sounds are normal. She exhibits no distension. There is no tenderness. There is no guarding.  Musculoskeletal: Normal range of motion. She exhibits no edema.       Arms: Pain with active and passive ROM of the right shoulder  But there is full range of motion present. Tenderness below shoulder joint with palpation. No visible deformity or sign of neurovascular compromise.  Neurological: She is alert. She exhibits normal muscle tone. Coordination normal.  Skin: Skin is warm and dry. No rash noted. She is not diaphoretic.  Psychiatric: She has a normal mood and affect.  Nursing note and vitals reviewed.    ED Treatments / Results  DIAGNOSTIC STUDIES:  Oxygen Saturation is 97% on RA, normal by my interpretation.    COORDINATION OF CARE:  3:09 PM Discussed treatment plan with pt at bedside and pt agreed to plan.  Labs (all labs ordered are listed, but only abnormal results are  displayed) Labs Reviewed - No data to display  EKG  EKG Interpretation None       Radiology Dg Shoulder Right  Result Date: 05/13/2016 CLINICAL DATA:  Shoulder pain for 2 days.  Trauma 6 weeks ago. EXAM: RIGHT SHOULDER - 2+ VIEW COMPARISON:  None. FINDINGS: Mild degenerative changes. No fracture or traumatic malalignment. Densities over the upper arm on only the initial view were on the patient and are not seen on subsequent views. IMPRESSION: Negative. Electronically Signed   By: Dorise Bullion III M.D   On: 05/13/2016 15:01    Procedures Procedures (including critical care time)  Medications Ordered in ED Medications  meloxicam (MOBIC) tablet 7.5 mg (7.5 mg Oral Given 05/13/16 1554)  methocarbamol (ROBAXIN) tablet 500 mg (500  mg Oral Given 05/13/16 1554)     Initial Impression / Assessment and Plan / ED Course  I have reviewed the triage vital signs and the nursing notes.  Pertinent labs & imaging results that were available during my care of the patient were reviewed by me and considered in my medical decision making (see chart for details).     Patient X-Ray negative for obvious fracture or dislocation.  Pt advised to follow up with orthopedics. Symptoms likely due to musculoskeletal strain. Low suspicion for any neurological abnormality causing weakness. Patient was told that usually anti-inflammatories and/or muscle relaxers will help with this type of pain. She requests something stronger to "knock out the pain." I told her several times about our narcotic policy. She agreed to finally have one dose of Mobic and Robaxin here in the ED and be discharged home with them. Returns precautions discussed. Pt appears safe for discharge.   Final Clinical Impressions(s) / ED Diagnoses   Final diagnoses:  Right shoulder pain, unspecified chronicity    New Prescriptions Discharge Medication List as of 05/13/2016  3:48 PM    START taking these medications   Details  meloxicam  (MOBIC) 7.5 MG tablet Take 1 tablet (7.5 mg total) by mouth daily., Starting Sat 05/13/2016, Print    methocarbamol (ROBAXIN) 500 MG tablet Take 1 tablet (500 mg total) by mouth 2 (two) times daily., Starting Sat 05/13/2016, Print       I personally performed the services described in this documentation, which was scribed in my presence. The recorded information has been reviewed and is accurate.     Delia Heady, PA-C 05/13/16 1607    Lacretia Leigh, MD 05/18/16 [phone]

## 2017-06-13 ENCOUNTER — Ambulatory Visit (HOSPITAL_COMMUNITY)
Admission: RE | Admit: 2017-06-13 | Discharge: 2017-06-13 | Disposition: A | Discharge status: Home / Self Care | Payer: Medicaid Other | Source: Ambulatory Visit | Attending: Internal Medicine | Admitting: Internal Medicine

## 2017-06-13 ENCOUNTER — Other Ambulatory Visit (HOSPITAL_COMMUNITY): Payer: Self-pay | Admitting: Internal Medicine

## 2017-06-13 DIAGNOSIS — E7800 Pure hypercholesterolemia, unspecified: Secondary | ICD-10-CM | POA: Insufficient documentation

## 2019-09-04 ENCOUNTER — Other Ambulatory Visit: Payer: Self-pay | Admitting: Nurse Practitioner

## 2019-09-04 DIAGNOSIS — Z1231 Encounter for screening mammogram for malignant neoplasm of breast: Secondary | ICD-10-CM

## 2019-09-17 ENCOUNTER — Ambulatory Visit: Payer: Medicaid Other

## 2019-09-27 ENCOUNTER — Ambulatory Visit (HOSPITAL_COMMUNITY)
Admission: EM | Admit: 2019-09-27 | Discharge: 2019-09-27 | Disposition: A | Discharge status: Home / Self Care | Payer: Medicare Other

## 2019-09-27 ENCOUNTER — Encounter (HOSPITAL_COMMUNITY): Payer: Self-pay | Admitting: Emergency Medicine

## 2019-09-27 ENCOUNTER — Other Ambulatory Visit: Payer: Self-pay

## 2019-09-27 DIAGNOSIS — R42 Dizziness and giddiness: Secondary | ICD-10-CM

## 2019-09-27 DIAGNOSIS — M542 Cervicalgia: Secondary | ICD-10-CM

## 2019-09-27 MED ORDER — CYCLOBENZAPRINE HCL 10 MG PO TABS: 10.0000 mg | ORAL_TABLET | Freq: Two times a day (BID) | ORAL | 0 refills | Status: DC | PRN

## 2019-09-27 MED ORDER — MELOXICAM 15 MG PO TABS: 15.0000 mg | ORAL_TABLET | Freq: Every day | ORAL | 1 refills | Status: DC
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
## 2019-09-29 ENCOUNTER — Ambulatory Visit
Admission: RE | Admit: 2019-09-29 | Discharge: 2019-09-29 | Disposition: A | Discharge status: Home / Self Care | Payer: Medicare Other | Source: Ambulatory Visit | Attending: Obstetrics and Gynecology | Admitting: Obstetrics and Gynecology

## 2019-09-29 ENCOUNTER — Other Ambulatory Visit: Payer: Self-pay

## 2019-09-29 DIAGNOSIS — Z1231 Encounter for screening mammogram for malignant neoplasm of breast: Secondary | ICD-10-CM

## 2020-09-21 ENCOUNTER — Other Ambulatory Visit: Payer: Self-pay | Admitting: Nurse Practitioner

## 2020-09-21 DIAGNOSIS — Z1231 Encounter for screening mammogram for malignant neoplasm of breast: Secondary | ICD-10-CM

## 2020-10-21 ENCOUNTER — Encounter: Payer: Self-pay | Admitting: Obstetrics

## 2020-10-21 ENCOUNTER — Other Ambulatory Visit: Payer: Self-pay

## 2020-10-21 ENCOUNTER — Other Ambulatory Visit (HOSPITAL_COMMUNITY)
Admission: RE | Admit: 2020-10-21 | Discharge: 2020-10-21 | Disposition: A | Discharge status: Home / Self Care | Payer: Medicare Other | Source: Ambulatory Visit | Attending: Obstetrics | Admitting: Obstetrics

## 2020-10-21 ENCOUNTER — Ambulatory Visit (INDEPENDENT_AMBULATORY_CARE_PROVIDER_SITE_OTHER): Payer: Medicare Other | Admitting: Obstetrics

## 2020-10-21 VITALS — BP 158/91 | HR 101 | Ht 64.0 in | Wt 185.7 lb

## 2020-10-21 DIAGNOSIS — R350 Frequency of micturition: Secondary | ICD-10-CM

## 2020-10-21 DIAGNOSIS — F17200 Nicotine dependence, unspecified, uncomplicated: Secondary | ICD-10-CM

## 2020-10-21 DIAGNOSIS — Z1211 Encounter for screening for malignant neoplasm of colon: Secondary | ICD-10-CM

## 2020-10-21 DIAGNOSIS — Z01419 Encounter for gynecological examination (general) (routine) without abnormal findings: Secondary | ICD-10-CM | POA: Diagnosis present

## 2020-10-21 DIAGNOSIS — E66811 Obesity, class 1: Secondary | ICD-10-CM

## 2020-10-21 DIAGNOSIS — Z124 Encounter for screening for malignant neoplasm of cervix: Secondary | ICD-10-CM | POA: Diagnosis not present

## 2020-10-21 DIAGNOSIS — E669 Obesity, unspecified: Secondary | ICD-10-CM

## 2020-10-21 DIAGNOSIS — Z1151 Encounter for screening for human papillomavirus (HPV): Secondary | ICD-10-CM | POA: Diagnosis not present

## 2020-10-21 DIAGNOSIS — R140 Abdominal distension (gaseous): Secondary | ICD-10-CM | POA: Diagnosis not present

## 2020-10-21 DIAGNOSIS — N898 Other specified noninflammatory disorders of vagina: Secondary | ICD-10-CM | POA: Diagnosis not present

## 2020-10-21 DIAGNOSIS — E2839 Other primary ovarian failure: Secondary | ICD-10-CM

## 2020-10-21 DIAGNOSIS — Z1212 Encounter for screening for malignant neoplasm of rectum: Secondary | ICD-10-CM

## 2020-10-21 LAB — POCT URINALYSIS DIPSTICK
Bilirubin, UA: NEGATIVE
Blood, UA: NEGATIVE
Clarity, UA: NEGATIVE
Glucose, UA: NEGATIVE
Ketones, UA: NEGATIVE
Leukocytes, UA: NEGATIVE
Nitrite, UA: NEGATIVE
Protein, UA: NEGATIVE
Spec Grav, UA: 1.01 (ref 1.010–1.025)
Urobilinogen, UA: 0.2 E.U./dL
pH, UA: 7 (ref 5.0–8.0)

## 2020-10-21 NOTE — Progress Notes (Signed)
NGYN pt presents for annual c/o abdominal bloating Last pap: Unsure per pt  Mammogram: Scheduled 10/26/2020 Colonoscopy: 20+ years ago  Dexa Scan: Never

## 2020-10-21 NOTE — Progress Notes (Signed)
Subjective:        Rachel Wilcox is a 70 y.o. female here for a routine exam.  Current complaints: Vaginal discharge.    Personal health questionnaire:  Is patient Ashkenazi Jewish, have a family history of breast and/or ovarian cancer: no Is there a family history of uterine cancer diagnosed at age < 21, gastrointestinal cancer, urinary tract cancer, family member who is a Field seismologist syndrome-associated carrier: no Is the patient overweight and hypertensive, family history of diabetes, personal history of gestational diabetes, preeclampsia or PCOS: no Is patient over 41, have PCOS,  family history of premature CHD under age 55, diabetes, smoke, have hypertension or peripheral artery disease:  no At any time, has a partner hit, kicked or otherwise hurt or frightened you?: no Over the past 2 weeks, have you felt down, depressed or hopeless?: no Over the past 2 weeks, have you felt little interest or pleasure in doing things?:no   Gynecologic History No LMP recorded. Patient is postmenopausal. Contraception: post menopausal status Last Pap: > 5 years. Results were: normal Last mammogram: 09-29-2019. Results were: normal  Obstetric History OB History  No obstetric history on file.    Past Medical History:  Diagnosis Date   Arthritis    CHF (congestive heart failure) ([facility])    Diabetes mellitus    Glaucoma    Hypertension     Past Surgical History:  Procedure Laterality Date   CESAREAN SECTION     WISDOM TOOTH EXTRACTION       Current Outpatient Medications:    aspirin 81 MG tablet, Take 81 mg by mouth daily., Disp: , Rfl:    benazepril-hydrochlorthiazide (LOTENSIN HCT) 20-12.5 MG per tablet, Take 1 tablet by mouth 2 (two) times daily., Disp: 60 tablet, Rfl: 1   glimepiride (AMARYL) 4 MG tablet, Take 4 mg by mouth 2 (two) times daily. , Disp: , Rfl:    latanoprost (XALATAN) 0.005 % ophthalmic solution, Place 1 drop into both eyes at bedtime., Disp: , Rfl:     lisinopril-hydrochlorothiazide (ZESTORETIC) 20-25 MG tablet, Take 1 tablet by mouth daily., Disp: , Rfl:    cyclobenzaprine (FLEXERIL) 10 MG tablet, Take 1 tablet (10 mg total) by mouth 2 (two) times daily as needed for muscle spasms. (Patient not taking: Reported on 10/21/2020), Disp: 20 tablet, Rfl: 0   HYDROcodone-acetaminophen (NORCO) 5-325 MG per tablet, Take 1 tablet by mouth every 6 (six) hours as needed for severe pain. (Patient not taking: Reported on 10/21/2020), Disp: 6 tablet, Rfl: 0   ibuprofen (ADVIL,MOTRIN) 800 MG tablet, Take 800 mg by mouth every 12 (twelve) hours as needed. for pain (Patient not taking: Reported on 10/21/2020), Disp: , Rfl: 2   lisinopril-hydrochlorothiazide (ZESTORETIC) 10-12.5 MG tablet, Take 1 tablet by mouth daily., Disp: , Rfl:    meloxicam (MOBIC) 15 MG tablet, Take 1 tablet (15 mg total) by mouth daily. (Patient not taking: Reported on 10/21/2020), Disp: 30 tablet, Rfl: 1   methocarbamol (ROBAXIN) 500 MG tablet, Take 1 tablet (500 mg total) by mouth 2 (two) times daily. (Patient not taking: Reported on 10/21/2020), Disp: 20 tablet, Rfl: 0   naproxen (NAPROSYN) 500 MG tablet, Take 1 tablet (500 mg total) by mouth 2 (two) times daily as needed for mild pain, moderate pain or headache (TAKE WITH MEALS.). (Patient not taking: Reported on 10/21/2020), Disp: 20 tablet, Rfl: 0   NON FORMULARY, Blood pressure medicine. Cannot state the name of drugs (Patient not taking: Reported on 10/21/2020), Disp: , Rfl:  PredniSONE 10 MG KIT, 6 day dose pack po (Patient not taking: Reported on 10/21/2020), Disp: 1 kit, Rfl: 0   traMADol (ULTRAM) 50 MG tablet, Take 1 tablet (50 mg total) by mouth every 6 (six) hours as needed. (Patient not taking: Reported on 10/21/2020), Disp: 15 tablet, Rfl: 0 Allergies  Allergen Reactions   Latex Rash    Social History   Tobacco Use   Smoking status: Some Days    Packs/day: 0.50    Types: Cigarettes   Smokeless tobacco: Never  Substance  Use Topics   Alcohol use: Yes    Comment: rarely    History reviewed. No pertinent family history.    Review of Systems  Constitutional: negative for fatigue and weight loss Respiratory: negative for cough and wheezing Cardiovascular: negative for chest pain, fatigue and palpitations Gastrointestinal:  positive for abdominal bloating; and negative for change in bowel habits Musculoskeletal:negative for myalgias Neurological: negative for gait problems and tremors Behavioral/Psych: negative for abusive relationship, depression Endocrine: negative for temperature intolerance    Genitourinary: positive for vaginal discharge and urinary frequency.  negative for abnormal menstrual periods, genital lesions, hot flashes, sexual problems  Integument/breast: negative for breast lump, breast tenderness, nipple discharge and skin lesion(s)    Objective:       BP (!) 158/91   Pulse (!) 101   Ht $R'5\' 4"'fO$  (1.626 m)   Wt 185 lb 11.2 oz (84.2 kg)   BMI 31.88 kg/m  General:   Alert and no distress  Skin:   no rash or abnormalities  Lungs:   clear to auscultation bilaterally  Heart:   regular rate and rhythm, S1, S2 normal, no murmur, click, rub or gallop  Breasts:   normal without suspicious masses, skin or nipple changes or axillary nodes  Abdomen:  normal findings: no organomegaly, soft, non-tender and no hernia  Pelvis:  External genitalia: normal general appearance Urinary system: urethral meatus normal and bladder without fullness, nontender Vaginal: normal without tenderness, induration or masses Cervix: normal appearance Adnexa: normal bimanual exam Uterus: anteverted and non-tender, normal size   Lab Review Urine pregnancy test Labs reviewed yes Radiologic studies reviewed yes  I have spent a total of 20 minutes of face-to-face and non-face-to-face time, excluding clinical staff time, reviewing notes and preparing to see patient, ordering tests and/or medications, and counseling  the patient.   Assessment:    1. Encounter for gynecological examination with Papanicolaou smear of cervix Rx: - Cytology - PAP( [facility])  2. Vaginal discharge Rx: - Cervicovaginal ancillary only( [facility])  3. Hypoestrogenism Rx: - DG BONE DENSITY (DXA); Future  4. Abdominal bloating Rx: - US PELVIC COMPLETE WITH TRANSVAGINAL; Future  5. Screening for colorectal cancer Rx: - Ambulatory referral to Gastroenterology  6. Urinary frequency Rx: - POCT Urinalysis Dipstick - urine culture   7. Tobacco dependency - cessation with the aid of medication and behavioral modification recommended  8. Obesity (BMI 30.0-34.9) - weight reduction with the aid of dietary changes, exercise and behavioral modification recommended     Plan:    Education reviewed: calcium supplements, depression evaluation, low fat, low cholesterol diet, safe sex/STD prevention, self breast exams, smoking cessation, and weight bearing exercise. Contraception: post menopausal status. Mammogram ordered. Follow up in: 2 weeks.  Bone Density study ordered Colonoscopy ordered   Orders Placed This Encounter  Procedures   DG BONE DENSITY (DXA)    Standing Status:   Future    Standing Expiration Date:   10/21/2021  Order Specific Question:   Reason for Exam (SYMPTOM  OR DIAGNOSIS REQUIRED)    Answer:   Hypoestrogenism.  Screening for osteoporosis    Order Specific Question:   Preferred imaging location?    Answer:   [facility]   Ambulatory referral to Gastroenterology    Referral Priority:   Routine    Referral Type:   Consultation    Referral Reason:   Specialty Services Required    Number of Visits Requested:   1     Shelly Bombard, MD 10/21/2020 3:36 PM

## 2020-10-22 ENCOUNTER — Telehealth: Payer: Self-pay | Admitting: Nurse Practitioner

## 2020-10-22 LAB — CERVICOVAGINAL ANCILLARY ONLY
Bacterial Vaginitis (gardnerella): NEGATIVE
Candida Glabrata: NEGATIVE
Candida Vaginitis: NEGATIVE
Chlamydia: NEGATIVE
Comment: NEGATIVE
Comment: NEGATIVE
Comment: NEGATIVE
Comment: NEGATIVE
Comment: NEGATIVE
Comment: NORMAL
Neisseria Gonorrhea: NEGATIVE
Trichomonas: NEGATIVE

## 2020-10-22 NOTE — Telephone Encounter (Signed)
Pt not interested at this time for the new covid vaccine.  Will call back.

## 2020-10-26 ENCOUNTER — Other Ambulatory Visit: Payer: Self-pay

## 2020-10-26 ENCOUNTER — Ambulatory Visit
Admission: RE | Admit: 2020-10-26 | Discharge: 2020-10-26 | Disposition: A | Discharge status: Home / Self Care | Payer: Medicare Other | Source: Ambulatory Visit

## 2020-10-26 DIAGNOSIS — Z1231 Encounter for screening mammogram for malignant neoplasm of breast: Secondary | ICD-10-CM

## 2020-10-26 LAB — CYTOLOGY - PAP
Comment: NEGATIVE
High risk HPV: NEGATIVE

## 2020-10-26 LAB — URINE CULTURE

## 2020-10-27 ENCOUNTER — Other Ambulatory Visit: Payer: Self-pay | Admitting: Obstetrics

## 2020-10-27 DIAGNOSIS — N3000 Acute cystitis without hematuria: Secondary | ICD-10-CM

## 2020-10-27 MED ORDER — CEFUROXIME AXETIL 500 MG PO TABS: 500.0000 mg | ORAL_TABLET | Freq: Two times a day (BID) | ORAL | 0 refills | Status: DC

## 2020-11-04 ENCOUNTER — Other Ambulatory Visit: Payer: Self-pay

## 2020-11-04 ENCOUNTER — Ambulatory Visit
Admission: RE | Admit: 2020-11-04 | Discharge: 2020-11-04 | Disposition: A | Discharge status: Home / Self Care | Payer: Medicare Other | Source: Ambulatory Visit | Attending: Obstetrics | Admitting: Obstetrics

## 2020-11-04 DIAGNOSIS — R140 Abdominal distension (gaseous): Secondary | ICD-10-CM

## 2020-11-11 ENCOUNTER — Encounter: Payer: Self-pay | Admitting: Obstetrics

## 2020-11-11 ENCOUNTER — Ambulatory Visit (INDEPENDENT_AMBULATORY_CARE_PROVIDER_SITE_OTHER): Payer: Medicare Other | Admitting: Obstetrics

## 2020-11-11 DIAGNOSIS — R140 Abdominal distension (gaseous): Secondary | ICD-10-CM | POA: Diagnosis not present

## 2020-11-11 DIAGNOSIS — Z780 Asymptomatic menopausal state: Secondary | ICD-10-CM | POA: Diagnosis not present

## 2020-11-11 DIAGNOSIS — D250 Submucous leiomyoma of uterus: Secondary | ICD-10-CM | POA: Diagnosis not present

## 2020-11-11 NOTE — Progress Notes (Signed)
Virtual Visit via Telephone Note  I connected with Rachel Wilcox on 11/11/20 at 11:15 AM EDT by telephone and verified that I am speaking with the correct person using two identifiers.  Location: Patient: home Provider: [facility]  F/u after pelvic u/s for bloating

## 2020-11-11 NOTE — Progress Notes (Signed)
TELEHEALTH GYNECOLOGY VISIT ENCOUNTER NOTE  Provider location: [facility] [place] [facility]   Patient location: Home  I connected with Rachel Wilcox on 11/11/20 at 11:15 AM EDT by telephone and verified that I am speaking with the correct person using two identifiers. Patient was unable to do MyChart audiovisual encounter due to technical difficulties, she tried several times.    I discussed the limitations, risks, security and privacy concerns of performing an evaluation and management service by telephone and the availability of in person appointments. I also discussed with the patient that there may be a patient responsible charge related to this service. The patient expressed understanding and agreed to proceed.   History:  Rachel Wilcox is a 70 y.o. No obstetric history on file. female being evaluated today for ultrasound results for history of abdominal bloating. She denies any abnormal vaginal discharge, bleeding, pelvic pain or other concerns.       Past Medical History:  Diagnosis Date   Arthritis    CHF (congestive heart failure) (HCC)    Diabetes mellitus    Glaucoma    Hypertension    Past Surgical History:  Procedure Laterality Date   CESAREAN SECTION     WISDOM TOOTH EXTRACTION     The following portions of the patient's history were reviewed and updated as appropriate: allergies, current medications, past family history, past medical history, past social history, past surgical history and problem list.   Health Maintenance: LGSIL pap and on 10-21-2020.  Normal mammogram on 10-24-2020.   Review of Systems:  Pertinent items noted in HPI and remainder of comprehensive ROS otherwise negative.  Physical Exam:   General:  Alert, oriented and cooperative.   Mental Status: Normal mood and affect perceived. Normal judgment and thought content.  Physical exam deferred due to nature of the encounter  Labs and Imaging No results found for this or any  previous visit (from the past 336 hour(s)). MM 3D SCREEN BREAST BILATERAL  Result Date: 10/29/2020 CLINICAL DATA:  Screening. EXAM: DIGITAL SCREENING BILATERAL MAMMOGRAM WITH TOMOSYNTHESIS AND CAD TECHNIQUE: Bilateral screening digital craniocaudal and mediolateral oblique mammograms were obtained. Bilateral screening digital breast tomosynthesis was performed. The images were evaluated with computer-aided detection. COMPARISON:  Previous exam(s). ACR Breast Density Category c: The breast tissue is heterogeneously dense, which may obscure small masses. FINDINGS: There are no findings suspicious for malignancy. IMPRESSION: No mammographic evidence of malignancy. A result letter of this screening mammogram will be mailed directly to the patient. RECOMMENDATION: Screening mammogram in one year. (Code:SM-B-01Y) BI-RADS CATEGORY  1: Negative. Electronically Signed   By: Valentino Saxon M.D.   On: 10/29/2020 16:17   US PELVIC COMPLETE WITH TRANSVAGINAL  Result Date: 11/04/2020 CLINICAL DATA:  M bloating for 3-4 months, postmenopausal EXAM: TRANSABDOMINAL AND TRANSVAGINAL ULTRASOUND OF PELVIS TECHNIQUE: Both transabdominal and transvaginal ultrasound examinations of the pelvis were performed. Transabdominal technique was performed for global imaging of the pelvis including uterus, ovaries, adnexal regions, and pelvic cul-de-sac. It was necessary to proceed with endovaginal exam following the transabdominal exam to visualize the uterus, endometrium, and ovaries. COMPARISON:  None FINDINGS: Uterus Measurements: 5.9 x 3.2 x 4.5 cm = volume: 44 mL. Retroverted. Heterogeneous myometrium. Submucosal leiomyoma at anterior upper uterus 12 mm diameter. Endometrium Thickness: 2 mm. Complex fluid within endometrial canal at upper uterine segment slightly hyperechoic nodule at fundal aspect of endometrial canal 7 x 5 x 7 mm, question endometrial polyp versus submucosal leiomyoma protruding into the endometrial canal.  Right  ovary Measurements: 2.4 x 0.8 x 2.2 cm = volume: 2.1 mL. Normal morphology without mass Left ovary Measurements: 1.8 x 1.1 x 1.9 cm = volume: 2.1 mL. Normal morphology without mass Other findings No free pelvic fluid.  No adnexal masses. IMPRESSION: 12 mm submucosal leiomyoma at anterior upper uterus. 7 mm endometrial polyp versus submucosal leiomyoma protruding into endometrial canal at upper uterine segment; consider sonohysterogram for further evaluation, prior to hysteroscopy or endometrial biopsy. Unremarkable ovaries and adnexa. Electronically Signed   By: Lavonia Dana M.D.   On: 11/04/2020 16:18      Assessment and Plan:     1. Abdominal bloating - ultrasound with normal ovaries and asymptomatic leiomyoma - GI consultation pending  2. Postmenopause - clinically stable from a Gyn standpoint  3. Submucous leiomyoma of uterus - asymptomatic.  Will follow clinically.       I discussed the assessment and treatment plan with the patient. The patient was provided an opportunity to ask questions and all were answered. The patient agreed with the plan and demonstrated an understanding of the instructions.   The patient was advised to call back or seek an in-person evaluation/go to the ED if the symptoms worsen or if the condition fails to improve as anticipated.  I have spent a total of 15 minutes of non-face-to-face time, excluding clinical staff time, reviewing notes and preparing to see patient, ordering tests and/or medications, and counseling the patient.    Baltazar Najjar, MD [facility]  11/11/20

## 2020-11-17 ENCOUNTER — Other Ambulatory Visit (HOSPITAL_COMMUNITY)
Admission: RE | Admit: 2020-11-17 | Discharge: 2020-11-17 | Disposition: A | Discharge status: Home / Self Care | Payer: Medicare Other | Source: Ambulatory Visit | Attending: Obstetrics | Admitting: Obstetrics

## 2020-11-17 ENCOUNTER — Encounter: Payer: Self-pay | Admitting: Obstetrics

## 2020-11-17 ENCOUNTER — Ambulatory Visit (INDEPENDENT_AMBULATORY_CARE_PROVIDER_SITE_OTHER): Payer: Medicare Other | Admitting: Obstetrics

## 2020-11-17 ENCOUNTER — Other Ambulatory Visit: Payer: Self-pay

## 2020-11-17 VITALS — BP 153/93 | HR 63 | Ht 64.0 in | Wt 185.3 lb

## 2020-11-17 DIAGNOSIS — R52 Pain, unspecified: Secondary | ICD-10-CM

## 2020-11-17 DIAGNOSIS — R87612 Low grade squamous intraepithelial lesion on cytologic smear of cervix (LGSIL): Secondary | ICD-10-CM

## 2020-11-17 DIAGNOSIS — N3000 Acute cystitis without hematuria: Secondary | ICD-10-CM

## 2020-11-17 MED ORDER — IBUPROFEN 800 MG PO TABS: 800.0000 mg | ORAL_TABLET | Freq: Two times a day (BID) | ORAL | 2 refills | Status: DC | PRN

## 2020-11-17 MED ORDER — CEFUROXIME AXETIL 500 MG PO TABS: 500.0000 mg | ORAL_TABLET | Freq: Two times a day (BID) | ORAL | 0 refills | Status: DC

## 2020-11-17 NOTE — Progress Notes (Signed)
Colposcopy PAP 10/21/20 LGSIL, transformational zone present

## 2020-11-17 NOTE — Progress Notes (Signed)
Colposcopy Procedure Note  Indications: Pap smear 1 months ago showed: low-grade squamous intraepithelial neoplasia (LGSIL - encompassing HPV,mild dysplasia,CIN I). The prior pap showed no abnormalities.  Prior cervical/vaginal disease: normal exam without visible pathology. Prior cervical treatment: no treatment.  Procedure Details  The risks and benefits of the procedure and Written informed consent obtained.  A time-out was performed confirming the patient, procedure and allergy status  Speculum placed in vagina and excellent visualization of cervix achieved, cervix swabbed x 3 with acetic acid solution.  Findings: Cervix: no visible lesions, no mosaicism, no punctation, and no abnormal vasculature; endocervical curettage performed, cervical biopsies taken at 6 and 12 o'clock, specimen labelled and sent to pathology, and hemostasis achieved with silver nitrate.   Vaginal inspection: normal without visible lesions. Vulvar colposcopy: vulvar colposcopy not performed.   Physical Exam   Specimens: ECC and Cervical Biopsies  Complications: none.  Plan: Specimens labelled and sent to Pathology. Will base further treatment on Pathology findings. Treatment options discussed with patient. Post biopsy instructions given to patient. Return to discuss Pathology results in 2 weeks  Shelly Bombard, MD 11/17/2020 2:21 PM .

## 2020-11-17 NOTE — Addendum Note (Signed)
Addended by: Baltazar Najjar A on: 11/17/2020 04:49 PM   Modules accepted: Orders

## 2020-11-19 LAB — SURGICAL PATHOLOGY

## 2020-11-30 ENCOUNTER — Encounter: Payer: Self-pay | Admitting: Obstetrics

## 2020-11-30 ENCOUNTER — Ambulatory Visit (INDEPENDENT_AMBULATORY_CARE_PROVIDER_SITE_OTHER): Payer: Medicare Other | Admitting: Obstetrics

## 2020-11-30 DIAGNOSIS — N870 Mild cervical dysplasia: Secondary | ICD-10-CM | POA: Diagnosis not present

## 2020-11-30 DIAGNOSIS — R140 Abdominal distension (gaseous): Secondary | ICD-10-CM

## 2020-11-30 NOTE — Progress Notes (Addendum)
TELEHEALTH GYNECOLOGY VISIT ENCOUNTER NOTE  Provider location: [facility] [place] [facility]   Patient location: Home  I connected with [name] on 11/30/20 at  2:30 PM EST by telephone and verified that I am speaking with the correct person using two identifiers. Patient was unable to do MyChart audiovisual encounter due to technical difficulties, she tried several times.    I discussed the limitations, risks, security and privacy concerns of performing an evaluation and management service by telephone and the availability of in person appointments. I also discussed with the patient that there may be a patient responsible charge related to this service. The patient expressed understanding and agreed to proceed.   History:  ALMINA SCHUL is a 70 y.o. No obstetric history on file. female being evaluated today for colposcopy results and management recommendations. She denies any abnormal vaginal discharge, bleeding, pelvic pain or other concerns.       Past Medical History:  Diagnosis Date   Arthritis    CHF (congestive heart failure) ([place])    Diabetes mellitus    Glaucoma    Hypertension    Past Surgical History:  Procedure Laterality Date   CESAREAN SECTION     WISDOM TOOTH EXTRACTION     The following portions of the patient's history were reviewed and updated as appropriate: allergies, current medications, past family history, past medical history, past social history, past surgical history and problem list.   Health Maintenance:  LGSIL pap and positive HRHPV on 11-17-2020.    Review of Systems:  Pertinent items noted in HPI and remainder of comprehensive ROS otherwise negative.  Physical Exam:   General:  Alert, oriented and cooperative.   Mental Status: Normal mood and affect perceived. Normal judgment and thought content.  Physical exam deferred due to nature of the encounter  Labs and Imaging Results for orders placed or performed in visit on  11/17/20 (from the past 336 hour(s))  Surgical pathology   Collection Time: 11/17/20  4:48 PM  Result Value Ref Range   SURGICAL PATHOLOGY      SURGICAL PATHOLOGY CASE: [id number] PATIENT: Genia Del Surgical Pathology Report     Clinical History: LGSIL (cm)     FINAL MICROSCOPIC DIAGNOSIS:  A.CERVIX, BIOPSY: Low-grade SIL (CIN 1 / mild dysplasia.)  with changes suggestive of HPV related lesion. Squamocolumnar junction is not identified in the biopsy. Negative for high-grade SIL.  B. ENDOCERVIX, CURETTAGE Mucus mixed with isolated strips of benign squamous epithelium. Endocervical epithelium is not identified in the specimen. Negative for SIL.    GROSS DESCRIPTION:  A: Received in formalin are tan, soft tissue fragments that are submitted in toto. Number: 2 size: 0.2 and 0.4 cm blocks: 1  B: Received in formalin is blood tinged mucus that is entirely submitted in one block.  Volume: 1.2 x 1 x 0.3 cm (GRP 11/18/2020)    Final Diagnosis performed by Unknown Jim, MD.   Electronically signed 11/19/2020 Technical and / or Professional components performed at [employer]. [facility], [place] [address] [place], [place] [address].  Immunohistochemistry Technical component (if applicable) was performed at [facility]. [address], [place], [place], [place] [address].   IMMUNOHISTOCHEMISTRY DISCLAIMER (if applicable): Some of these immunohistochemical stains may have been developed and the performance characteristics determine by [facility]. Some may not have been cleared or approved by the U.S. Food and Drug Administration. The FDA has determined that such clearance or approval is  not necessary. This test is used for clinical purposes. It should not be regarded as investigational or for research. This laboratory is certified under the [place] (CLIA-88) as qualified to perform high  complexity clinical laboratory testing.  The controls stained appropriately.    US PELVIC COMPLETE WITH TRANSVAGINAL  Result Date: 11/04/2020 CLINICAL DATA:  M bloating for 3-4 months, postmenopausal EXAM: TRANSABDOMINAL AND TRANSVAGINAL ULTRASOUND OF PELVIS TECHNIQUE: Both transabdominal and transvaginal ultrasound examinations of the pelvis were performed. Transabdominal technique was performed for global imaging of the pelvis including uterus, ovaries, adnexal regions, and pelvic cul-de-sac. It was necessary to proceed with endovaginal exam following the transabdominal exam to visualize the uterus, endometrium, and ovaries. COMPARISON:  None FINDINGS: Uterus Measurements: 5.9 x 3.2 x 4.5 cm = volume: 44 mL. Retroverted. Heterogeneous myometrium. Submucosal leiomyoma at anterior upper uterus 12 mm diameter. Endometrium Thickness: 2 mm. Complex fluid within endometrial canal at upper uterine segment slightly hyperechoic nodule at fundal aspect of endometrial canal 7 x 5 x 7 mm, question endometrial polyp versus submucosal leiomyoma protruding into the endometrial canal. Right ovary Measurements: 2.4 x 0.8 x 2.2 cm = volume: 2.1 mL. Normal morphology without mass Left ovary Measurements: 1.8 x 1.1 x 1.9 cm = volume: 2.1 mL. Normal morphology without mass Other findings No free pelvic fluid.  No adnexal masses. IMPRESSION: 12 mm submucosal leiomyoma at anterior upper uterus. 7 mm endometrial polyp versus submucosal leiomyoma protruding into endometrial canal at upper uterine segment; consider sonohysterogram for further evaluation, prior to hysteroscopy or endometrial biopsy. Unremarkable ovaries and adnexa. Electronically Signed   By: Lavonia Dana M.D.   On: 11/04/2020 16:18        Assessment and Plan:     1. Dysplasia of cervix, low grade (CIN 1) - diagnosis discussed with patient and all questions answered - repeat pap in 1 year   2. Abdominal bloating with pelvic pain, mild - ultrasound reveals  small leiomyoma.  No history of vaginal bleeding.  Will follow clinically.      I discussed the assessment and treatment plan with the patient. The patient was provided an opportunity to ask questions and all were answered. The patient agreed with the plan and demonstrated an understanding of the instructions.   The patient was advised to call back or seek an in-person evaluation/go to the ED if the symptoms worsen or if the condition fails to improve as anticipated.  I have spent a total of 15 minutes non-face-to-face time, excluding clinical staff time, reviewing notes and preparing to see patient, ordering tests and/or medications, and counseling the patient.    Baltazar Najjar, MD [facility]  11/30/20

## 2020-11-30 NOTE — Progress Notes (Signed)
Virtual Visit via Telephone Note  I connected with Rachel Wilcox on 11/30/20 at  2:30 PM EST by telephone and verified that I am speaking with the correct person using two identifiers.  F/U colposcopy test results

## 2021-03-29 ENCOUNTER — Inpatient Hospital Stay: Admission: RE | Admit: 2021-03-29 | Payer: Medicare Other | Source: Ambulatory Visit

## 2022-03-26 IMAGING — US US PELVIS COMPLETE WITH TRANSVAGINAL
1 series · 13 of 25 positions shown · non-contrast
Comparison: None

CLINICAL DATA: M bloating for 3-4 months, postmenopausal



[Series 1: us pelvis complete with transvaginal · 0.17mm/px · 78 acquisitions, 13 frames shown]
[im 1/78]
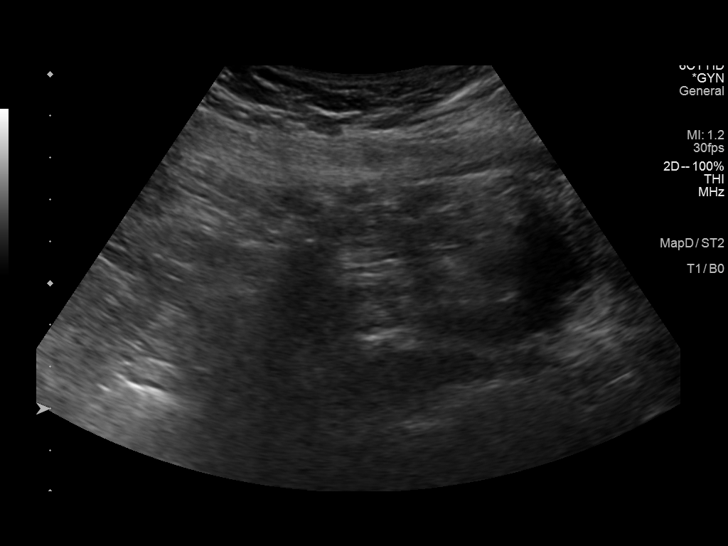
[im 7/78]
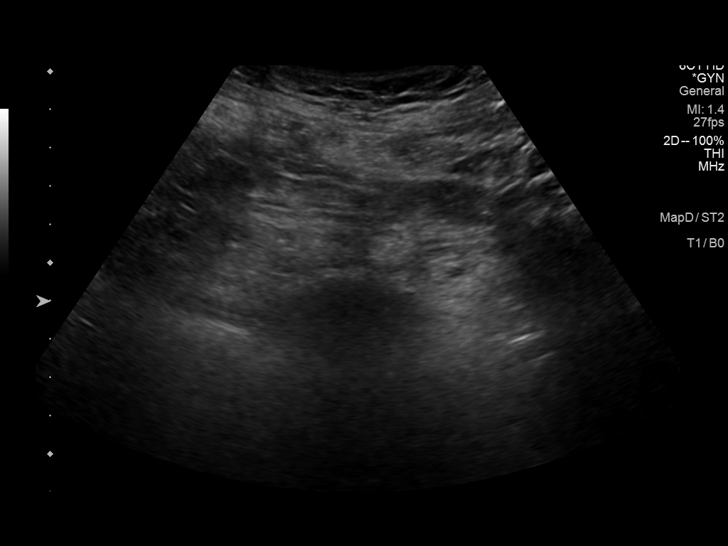
[im 13/78]
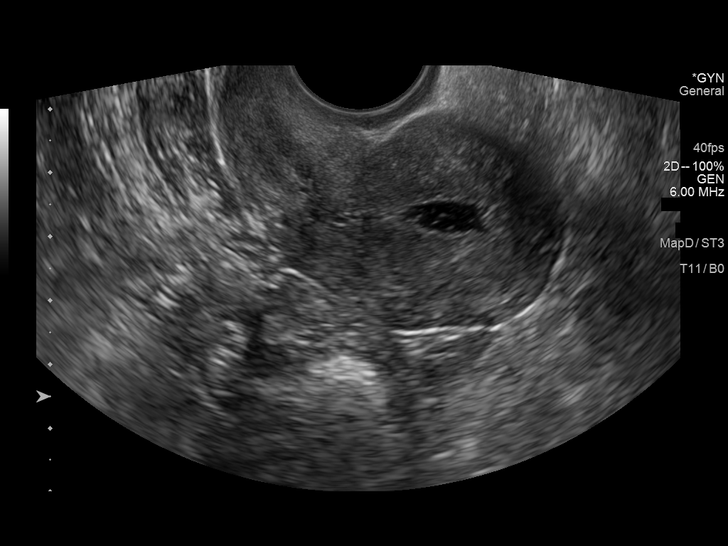
[im 20/78]
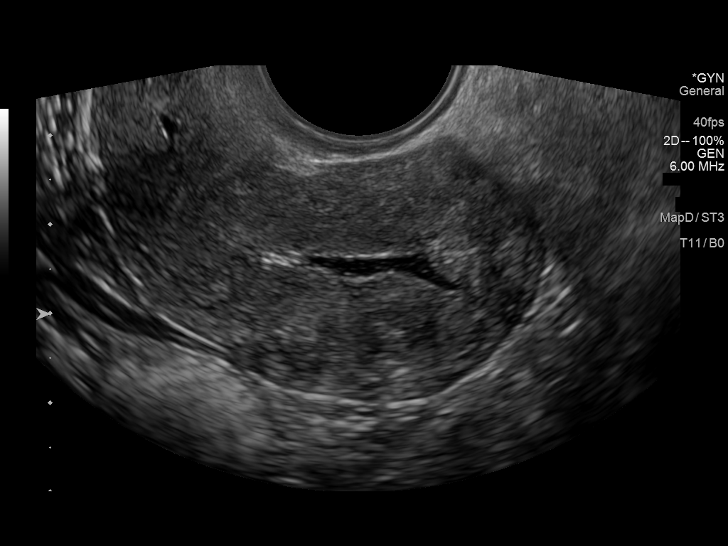
[im 26/78]
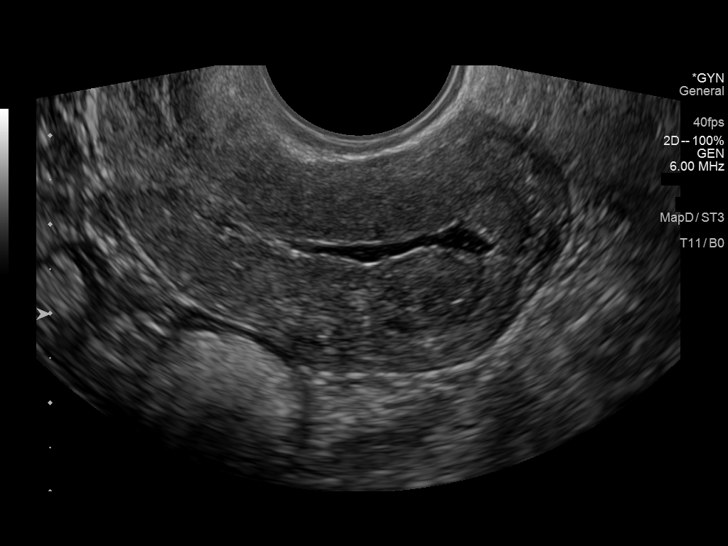
[im 33/78]
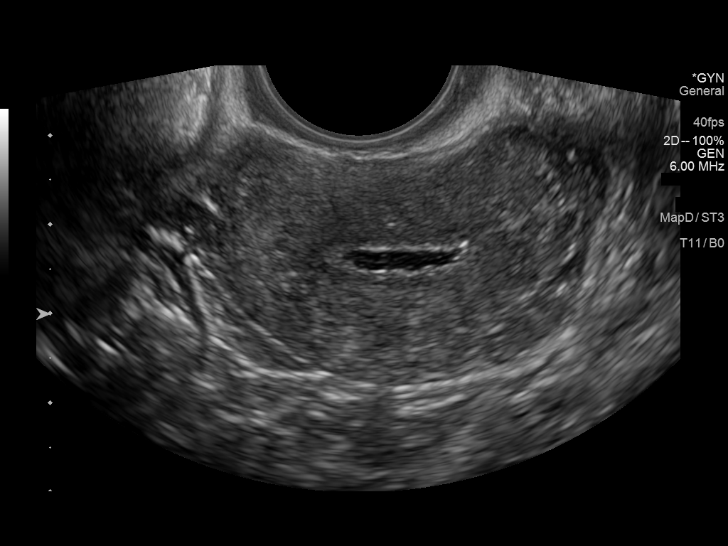
[im 39/78]
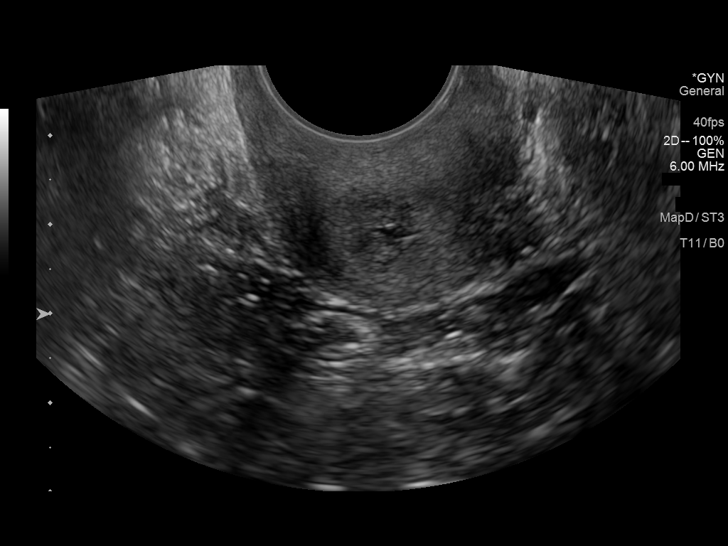
[im 45/78]
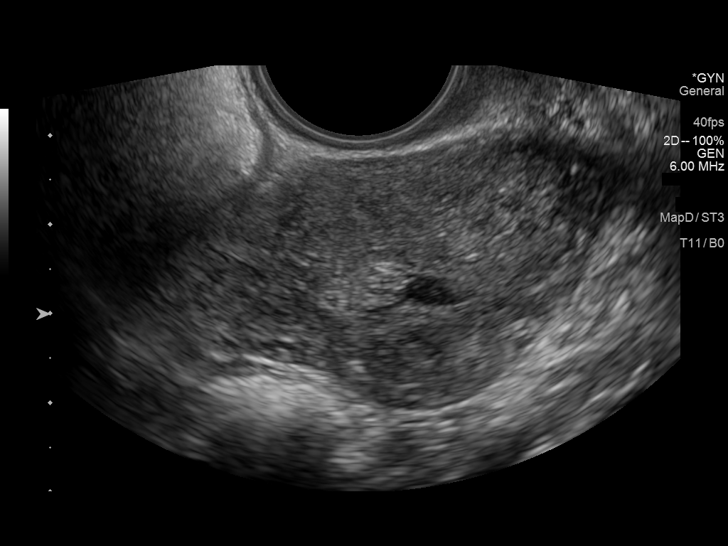
[im 52/78]
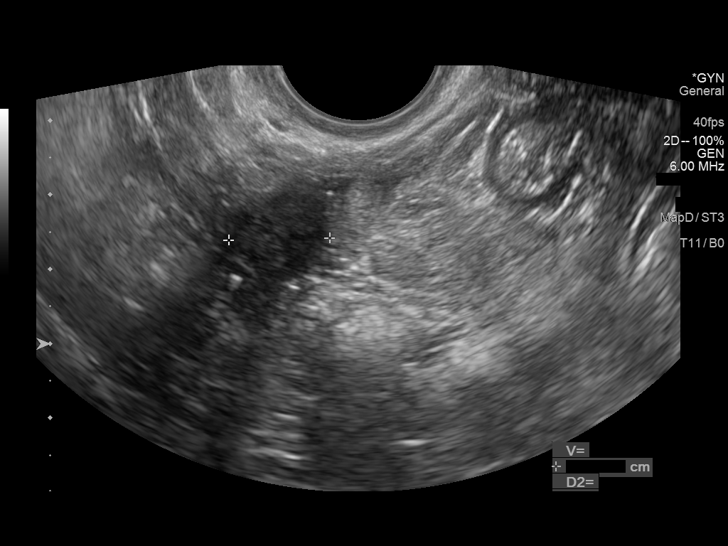
[im 58/78]
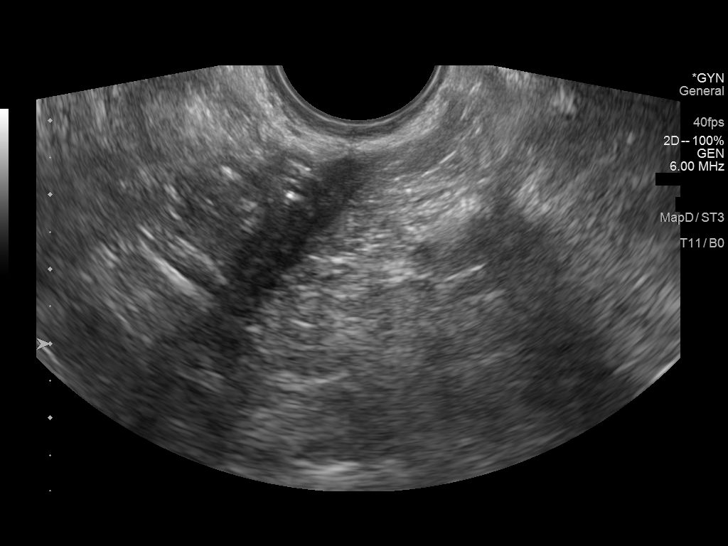
[im 65/78]
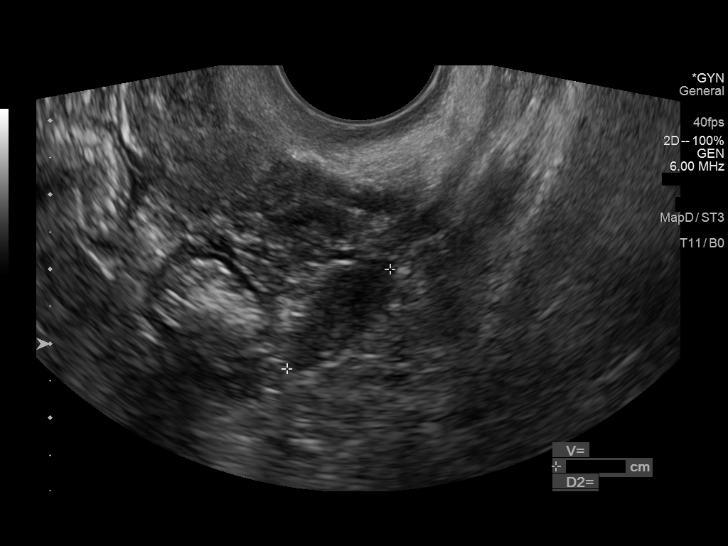
[im 71/78]
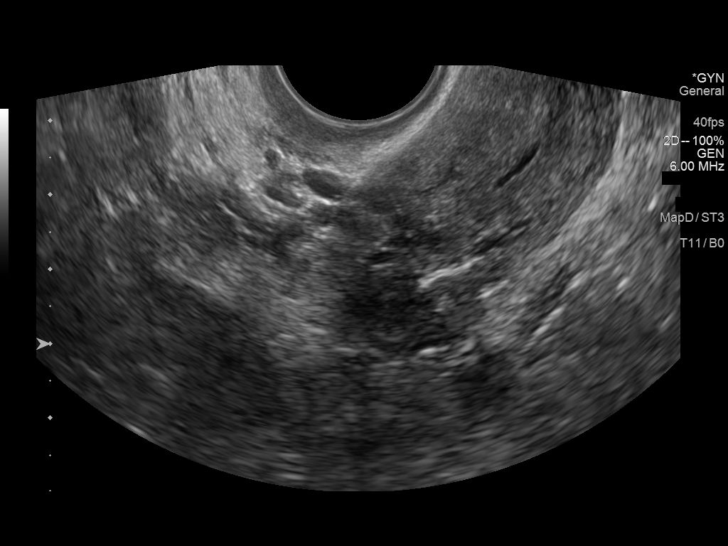
[im 78/78]
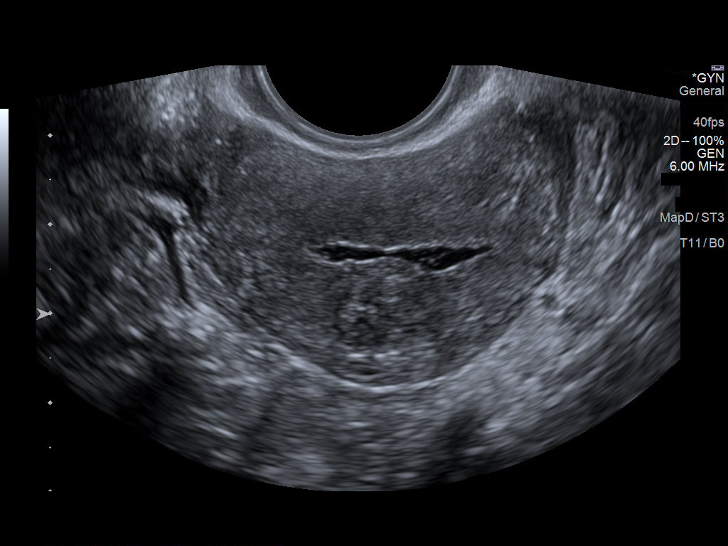

[13 of 25 positions shown; findings below may reference images not displayed]

FINDINGS: Uterus

Measurements: 5.9 x 3.2 x 4.5 cm = volume: 44 mL. Retroverted.
Heterogeneous myometrium. Submucosal leiomyoma at anterior upper
uterus 12 mm diameter.

Endometrium

Thickness: 2 mm. Complex fluid within endometrial canal at upper
uterine segment slightly hyperechoic nodule at fundal aspect of
endometrial canal 7 x 5 x 7 mm, question endometrial polyp versus
submucosal leiomyoma protruding into the endometrial canal.

Right ovary

Measurements: 2.4 x 0.8 x 2.2 cm = volume: 2.1 mL. Normal morphology
without mass

Left ovary

Measurements: 1.8 x 1.1 x 1.9 cm = volume: 2.1 mL. Normal morphology
without mass

Other findings

No free pelvic fluid.  No adnexal masses.
IMPRESSION: 12 mm submucosal leiomyoma at anterior upper uterus.

7 mm endometrial polyp versus submucosal leiomyoma protruding into
endometrial canal at upper uterine segment; consider sonohysterogram
for further evaluation, prior to hysteroscopy or endometrial biopsy.

Unremarkable ovaries and adnexa.

## 2022-05-25 ENCOUNTER — Other Ambulatory Visit: Payer: Self-pay | Admitting: Family

## 2022-05-25 ENCOUNTER — Ambulatory Visit
Admission: RE | Admit: 2022-05-25 | Discharge: 2022-05-25 | Disposition: A | Discharge status: Home / Self Care | Payer: Medicare Other | Source: Ambulatory Visit | Attending: Family | Admitting: Family

## 2022-05-25 DIAGNOSIS — M25571 Pain in right ankle and joints of right foot: Secondary | ICD-10-CM

## 2022-09-01 ENCOUNTER — Inpatient Hospital Stay (HOSPITAL_COMMUNITY)
Admission: EM | Admit: 2022-09-01 | Discharge: 2022-09-03 | DRG: 065 | Discharge status: Against Medical Advice | Payer: Medicare Other | Attending: Internal Medicine | Admitting: Internal Medicine

## 2022-09-01 ENCOUNTER — Emergency Department (HOSPITAL_COMMUNITY): Payer: Medicare Other

## 2022-09-01 DIAGNOSIS — R262 Difficulty in walking, not elsewhere classified: Secondary | ICD-10-CM | POA: Diagnosis not present

## 2022-09-01 DIAGNOSIS — G8194 Hemiplegia, unspecified affecting left nondominant side: Secondary | ICD-10-CM | POA: Diagnosis present

## 2022-09-01 DIAGNOSIS — Z79899 Other long term (current) drug therapy: Secondary | ICD-10-CM

## 2022-09-01 DIAGNOSIS — F17210 Nicotine dependence, cigarettes, uncomplicated: Secondary | ICD-10-CM | POA: Diagnosis not present

## 2022-09-01 DIAGNOSIS — E876 Hypokalemia: Secondary | ICD-10-CM | POA: Diagnosis not present

## 2022-09-01 DIAGNOSIS — Z7982 Long term (current) use of aspirin: Secondary | ICD-10-CM

## 2022-09-01 DIAGNOSIS — R29706 NIHSS score 6: Secondary | ICD-10-CM | POA: Diagnosis not present

## 2022-09-01 DIAGNOSIS — E669 Obesity, unspecified: Secondary | ICD-10-CM | POA: Diagnosis not present

## 2022-09-01 DIAGNOSIS — I5042 Chronic combined systolic (congestive) and diastolic (congestive) heart failure: Secondary | ICD-10-CM | POA: Diagnosis not present

## 2022-09-01 DIAGNOSIS — Z6830 Body mass index (BMI) 30.0-30.9, adult: Secondary | ICD-10-CM | POA: Diagnosis not present

## 2022-09-01 DIAGNOSIS — R531 Weakness: Secondary | ICD-10-CM | POA: Diagnosis present

## 2022-09-01 DIAGNOSIS — I6389 Other cerebral infarction: Principal | ICD-10-CM | POA: Diagnosis present

## 2022-09-01 DIAGNOSIS — I10 Essential (primary) hypertension: Secondary | ICD-10-CM | POA: Diagnosis present

## 2022-09-01 DIAGNOSIS — Z7902 Long term (current) use of antithrombotics/antiplatelets: Secondary | ICD-10-CM | POA: Diagnosis not present

## 2022-09-01 DIAGNOSIS — I110 Hypertensive heart disease with heart failure: Secondary | ICD-10-CM | POA: Diagnosis not present

## 2022-09-01 DIAGNOSIS — Z794 Long term (current) use of insulin: Secondary | ICD-10-CM | POA: Diagnosis not present

## 2022-09-01 DIAGNOSIS — Z7984 Long term (current) use of oral hypoglycemic drugs: Secondary | ICD-10-CM | POA: Diagnosis not present

## 2022-09-01 DIAGNOSIS — E785 Hyperlipidemia, unspecified: Secondary | ICD-10-CM | POA: Diagnosis not present

## 2022-09-01 DIAGNOSIS — Z91040 Latex allergy status: Secondary | ICD-10-CM | POA: Diagnosis not present

## 2022-09-01 DIAGNOSIS — Z791 Long term (current) use of non-steroidal anti-inflammatories (NSAID): Secondary | ICD-10-CM | POA: Diagnosis not present

## 2022-09-01 DIAGNOSIS — E119 Type 2 diabetes mellitus without complications: Secondary | ICD-10-CM | POA: Diagnosis not present

## 2022-09-01 DIAGNOSIS — I639 Cerebral infarction, unspecified: Secondary | ICD-10-CM | POA: Insufficient documentation

## 2022-09-01 LAB — RAPID URINE DRUG SCREEN, HOSP PERFORMED
Amphetamines: NOT DETECTED
Barbiturates: NOT DETECTED
Benzodiazepines: NOT DETECTED
Cocaine: NOT DETECTED
Opiates: NOT DETECTED
Tetrahydrocannabinol: NOT DETECTED

## 2022-09-01 LAB — COMPREHENSIVE METABOLIC PANEL
ALT: 12 U/L (ref 0–44)
AST: 17 U/L (ref 15–41)
Albumin: 3.2 g/dL — ABNORMAL LOW (ref 3.5–5.0)
Alkaline Phosphatase: 95 U/L (ref 38–126)
Anion gap: 15 (ref 5–15)
BUN: 14 mg/dL (ref 8–23)
CO2: 24 mmol/L (ref 22–32)
Calcium: 9.1 mg/dL (ref 8.9–10.3)
Chloride: 99 mmol/L (ref 98–111)
Creatinine, Ser: 0.61 mg/dL (ref 0.44–1.00)
GFR, Estimated: >60 mL/min (ref >60)
Glucose, Bld: 123 mg/dL — ABNORMAL HIGH (ref 70–99)
Potassium: 3.2 mmol/L — ABNORMAL LOW (ref 3.5–5.1)
Sodium: 138 mmol/L (ref 135–145)
Total Bilirubin: 0.2 mg/dL — ABNORMAL LOW (ref 0.3–1.2)
Total Protein: 7.1 g/dL (ref 6.5–8.1)

## 2022-09-01 LAB — CBC
HCT: 42.6 % (ref 36.0–46.0)
Hemoglobin: 14 g/dL (ref 12.0–15.0)
MCH: 29.5 pg (ref 26.0–34.0)
MCHC: 32.9 g/dL (ref 30.0–36.0)
MCV: 89.9 fL (ref 80.0–100.0)
Platelets: 203 10*3/uL (ref 150–400)
RBC: 4.74 MIL/uL (ref 3.87–5.11)
RDW: 14.7 % (ref 11.5–15.5)
WBC: 5.3 10*3/uL (ref 4.0–10.5)
nRBC: 0 % (ref 0.0–0.2)

## 2022-09-01 LAB — URINALYSIS, ROUTINE W REFLEX MICROSCOPIC
Bacteria, UA: NONE SEEN
Bilirubin Urine: NEGATIVE
Glucose, UA: NEGATIVE mg/dL
Ketones, ur: NEGATIVE mg/dL
Leukocytes,Ua: NEGATIVE
Nitrite: NEGATIVE
Protein, ur: NEGATIVE mg/dL
Specific Gravity, Urine: 1.014 (ref 1.005–1.030)
pH: 5 (ref 5.0–8.0)

## 2022-09-01 LAB — I-STAT CHEM 8, ED
BUN: 14 mg/dL (ref 8–23)
Calcium, Ion: 1.08 mmol/L — ABNORMAL LOW (ref 1.15–1.40)
Chloride: 101 mmol/L (ref 98–111)
Creatinine, Ser: 0.6 mg/dL (ref 0.44–1.00)
Glucose, Bld: 122 mg/dL — ABNORMAL HIGH (ref 70–99)
HCT: 42 % (ref 36.0–46.0)
Hemoglobin: 14.3 g/dL (ref 12.0–15.0)
Potassium: 3.1 mmol/L — ABNORMAL LOW (ref 3.5–5.1)
Sodium: 139 mmol/L (ref 135–145)
TCO2: 25 mmol/L (ref 22–32)

## 2022-09-01 LAB — DIFFERENTIAL
Abs Immature Granulocytes: 0.01 10*3/uL (ref 0.00–0.07)
Basophils Absolute: 0 10*3/uL (ref 0.0–0.1)
Basophils Relative: 1 %
Eosinophils Absolute: 0.1 10*3/uL (ref 0.0–0.5)
Eosinophils Relative: 1 %
Immature Granulocytes: 0 %
Lymphocytes Relative: 49 %
Lymphs Abs: 2.6 10*3/uL (ref 0.7–4.0)
Monocytes Absolute: 0.7 10*3/uL (ref 0.1–1.0)
Monocytes Relative: 12 %
Neutro Abs: 2 10*3/uL (ref 1.7–7.7)
Neutrophils Relative %: 37 %

## 2022-09-01 LAB — ETHANOL: Alcohol, Ethyl (B): <10 mg/dL (ref <10)

## 2022-09-01 LAB — APTT: aPTT: 33 seconds (ref 24–36)

## 2022-09-01 LAB — PROTIME-INR
INR: 1 (ref 0.8–1.2)
Prothrombin Time: 13.4 seconds (ref 11.4–15.2)

## 2022-09-01 NOTE — ED Notes (Signed)
ED TO INPATIENT HANDOFF REPORT  ED Nurse Name and Phone #: Delfin Edis / [phone]  S Name/Age/Gender Neena Rhymes 72 y.o. female Room/Bed: 029C/029C  Code Status   Code Status: Full Code  Home/SNF/Other Home Patient oriented to: self, place, time, and situation Is this baseline? Yes   Triage Complete: Triage complete  Chief Complaint Left-sided weakness [R53.1]  Triage Note Pt BIB EMS from home w/ complaint of left sided weakness onset this am around 10 am. Reports LKW 2200. Pt also states speech not at baseline. States left leg weak and unable to stand or walk without leaning to the right.     BP 140/70 HR 86 CBG 152 SpO2 97% RA   Allergies Allergies  Allergen Reactions   Latex Rash    Level of Care/Admitting Diagnosis ED Disposition     ED Disposition  Admit   Condition  --   Comment  Hospital Area: [facility] [[id number]]  Level of Care: Telemetry Medical [104]  May place patient in observation at [facility] or [facility] if equivalent level of care is available:: No  Covid Evaluation: Asymptomatic - no recent exposure (last 10 days) testing not required  Diagnosis: Left-sided weakness [009381]  Admitting Physician: Tonye Royalty [[id number]]  Attending Physician: Tonye Royalty [[id number]]          B Medical/Surgery History Past Medical History:  Diagnosis Date   Arthritis    CHF (congestive heart failure) (HCC)    Diabetes mellitus    Glaucoma    Hypertension    Past Surgical History:  Procedure Laterality Date   CESAREAN SECTION     WISDOM TOOTH EXTRACTION       A IV Location/Drains/Wounds Patient Lines/Drains/Airways Status     Active Line/Drains/Airways     Name Placement date Placement time Site Days   Peripheral IV 09/01/22 18 G Distal;Left Forearm 09/01/22  2116  Forearm  less than 1            Intake/Output Last 24 hours No intake or output data in the 24 hours ending 09/01/22  2306  Labs/Imaging Results for orders placed or performed during the hospital encounter of 09/01/22 (from the past 48 hour(s))  Ethanol     Status: None   Collection Time: 09/01/22  9:09 PM  Result Value Ref Range   Alcohol, Ethyl (B) <10 <10 mg/dL    Comment: (NOTE) Lowest detectable limit for serum alcohol is 10 mg/dL.  For medical purposes only. Performed at [facility], [address] [place], [place] [address]   Protime-INR     Status: None   Collection Time: 09/01/22  9:09 PM  Result Value Ref Range   Prothrombin Time 13.4 11.4 - 15.2 seconds   INR 1.0 0.8 - 1.2    Comment: (NOTE) INR goal varies based on device and disease states. Performed at [facility], [address], [place], [place] [address]   APTT     Status: None   Collection Time: 09/01/22  9:09 PM  Result Value Ref Range   aPTT 33 24 - 36 seconds    Comment: Performed at [facility], [address], [place], [place] [address]  CBC     Status: None   Collection Time: 09/01/22  9:09 PM  Result Value Ref Range   WBC 5.3 4.0 - 10.5 K/uL   RBC 4.74 3.87 - 5.11 MIL/uL   Hemoglobin 14.0 12.0 - 15.0 g/dL  HCT 42.6 36.0 - 46.0 %   MCV 89.9 80.0 - 100.0 fL   MCH 29.5 26.0 - 34.0 pg   MCHC 32.9 30.0 - 36.0 g/dL   RDW 21.3 08.6 - 57.8 %   Platelets 203 150 - 400 K/uL   nRBC 0.0 0.0 - 0.2 %    Comment: Performed at [facility], [address] [place], [place] [address]  Differential     Status: None   Collection Time: 09/01/22  9:09 PM  Result Value Ref Range   Neutrophils Relative % 37 %   Neutro Abs 2.0 1.7 - 7.7 K/uL   Lymphocytes Relative 49 %   Lymphs Abs 2.6 0.7 - 4.0 K/uL   Monocytes Relative 12 %   Monocytes Absolute 0.7 0.1 - 1.0 K/uL   Eosinophils Relative 1 %   Eosinophils Absolute 0.1 0.0 - 0.5 K/uL   Basophils Relative 1 %   Basophils Absolute 0.0 0.0 - 0.1 K/uL   Immature Granulocytes 0 %   Abs Immature Granulocytes 0.01 0.00 - 0.07 K/uL    Comment: Performed  at [facility], [address] [place], [place] [address]  Comprehensive metabolic panel     Status: Abnormal   Collection Time: 09/01/22  9:09 PM  Result Value Ref Range   Sodium 138 135 - 145 mmol/L   Potassium 3.2 (L) 3.5 - 5.1 mmol/L   Chloride 99 98 - 111 mmol/L   CO2 24 22 - 32 mmol/L   Glucose, Bld 123 (H) 70 - 99 mg/dL    Comment: Glucose reference range applies only to samples taken after fasting for at least 8 hours.   BUN 14 8 - 23 mg/dL   Creatinine, Ser 1.32 0.44 - 1.00 mg/dL   Calcium 9.1 8.9 - 44.0 mg/dL   Total Protein 7.1 6.5 - 8.1 g/dL   Albumin 3.2 (L) 3.5 - 5.0 g/dL   AST 17 15 - 41 U/L   ALT 12 0 - 44 U/L   Alkaline Phosphatase 95 38 - 126 U/L   Total Bilirubin 0.2 (L) 0.3 - 1.2 mg/dL   GFR, Estimated >10 >27 mL/min    Comment: (NOTE) Calculated using the CKD-EPI Creatinine Equation (2021)    Anion gap 15 5 - 15    Comment: Performed at [facility], [address], [place], [place] [address]  I-stat chem 8, ED     Status: Abnormal   Collection Time: 09/01/22  9:21 PM  Result Value Ref Range   Sodium 139 135 - 145 mmol/L   Potassium 3.1 (L) 3.5 - 5.1 mmol/L   Chloride 101 98 - 111 mmol/L   BUN 14 8 - 23 mg/dL   Creatinine, Ser 4.40 0.44 - 1.00 mg/dL   Glucose, Bld 347 (H) 70 - 99 mg/dL    Comment: Glucose reference range applies only to samples taken after fasting for at least 8 hours.   Calcium, Ion 1.08 (L) 1.15 - 1.40 mmol/L   TCO2 25 22 - 32 mmol/L   Hemoglobin 14.3 12.0 - 15.0 g/dL   HCT 42.5 95.6 - 38.7 %  Urine rapid drug screen (hosp performed)     Status: None   Collection Time: 09/01/22 10:16 PM  Result Value Ref Range   Opiates NONE DETECTED NONE DETECTED   Cocaine NONE DETECTED NONE DETECTED   Benzodiazepines NONE DETECTED NONE DETECTED   Amphetamines NONE DETECTED NONE DETECTED   Tetrahydrocannabinol NONE DETECTED NONE DETECTED  Barbiturates NONE DETECTED NONE DETECTED    Comment: (NOTE) DRUG SCREEN FOR MEDICAL  PURPOSES ONLY.  IF CONFIRMATION IS NEEDED FOR ANY PURPOSE, NOTIFY LAB WITHIN 5 DAYS.  LOWEST DETECTABLE LIMITS FOR URINE DRUG SCREEN Drug Class                     Cutoff (ng/mL) Amphetamine and metabolites    1000 Barbiturate and metabolites    200 Benzodiazepine                 200 Opiates and metabolites        300 Cocaine and metabolites        300 THC                            50 Performed at [facility], [address] [place], [place] [address]   Urinalysis, Routine w reflex microscopic -Urine, Clean Catch     Status: Abnormal   Collection Time: 09/01/22 10:16 PM  Result Value Ref Range   Color, Urine YELLOW YELLOW   APPearance HAZY (A) CLEAR   Specific Gravity, Urine 1.014 1.005 - 1.030   pH 5.0 5.0 - 8.0   Glucose, UA NEGATIVE NEGATIVE mg/dL   Hgb urine dipstick SMALL (A) NEGATIVE   Bilirubin Urine NEGATIVE NEGATIVE   Ketones, ur NEGATIVE NEGATIVE mg/dL   Protein, ur NEGATIVE NEGATIVE mg/dL   Nitrite NEGATIVE NEGATIVE   Leukocytes,Ua NEGATIVE NEGATIVE   RBC / HPF 0-5 0 - 5 RBC/hpf   WBC, UA 0-5 0 - 5 WBC/hpf   Bacteria, UA NONE SEEN NONE SEEN   Squamous Epithelial / HPF 6-10 0 - 5 /HPF   Mucus PRESENT    Hyaline Casts, UA PRESENT     Comment: Performed at [facility], [address] [place], [place] [address]   CT HEAD WO CONTRAST  Result Date: 09/01/2022 CLINICAL DATA:  Extremity weakness EXAM: CT HEAD WITHOUT CONTRAST TECHNIQUE: Contiguous axial images were obtained from the base of the skull through the vertex without intravenous contrast. RADIATION DOSE REDUCTION: This exam was performed according to the departmental dose-optimization program which includes automated exposure control, adjustment of the mA and/or kV according to patient size and/or use of iterative reconstruction technique. COMPARISON:  None Available. FINDINGS: Brain: No evidence of acute infarction, hemorrhage, hydrocephalus, extra-axial collection or mass lesion/mass effect.  Vascular: No hyperdense vessel or unexpected calcification. Skull: Normal. Negative for fracture or focal lesion. Sinuses/Orbits: No acute finding. Other: None. IMPRESSION: No acute intracranial abnormality noted. Electronically Signed   By: Alcide Clever M.D.   On: 09/01/2022 21:55    Pending Labs [employer] (From admission, onward)     Start     Ordered   Signed and Held  HIV Antibody (routine testing w rflx)  (HIV Antibody (Routine testing w reflex) panel)  Once,   R        Signed and Held   Signed and Held  Lipid panel  (Labs)  Tomorrow morning,   R       Comments: Fasting    Signed and Held   Signed and Held  Hemoglobin A1c  (Labs)  Once,   R       Comments: To assess prior glycemic control    Signed and Held   Signed and Held  CBC  (enoxaparin (LOVENOX)    CrCl >/= 30 ml/min)  Once,   R  Comments: Baseline for enoxaparin therapy IF NOT ALREADY DRAWN.  Notify MD if PLT < 100 K.    Signed and Held   Signed and Held  Creatinine, serum  (enoxaparin (LOVENOX)    CrCl >/= 30 ml/min)  Once,   R       Comments: Baseline for enoxaparin therapy IF NOT ALREADY DRAWN.    Signed and Held   Signed and Held  Creatinine, serum  (enoxaparin (LOVENOX)    CrCl >/= 30 ml/min)  Weekly,   R     Comments: while on enoxaparin therapy    Signed and Held   Signed and Held  Hemoglobin A1c  Once,   R       Comments: To assess prior glycemic control    Signed and Held   Signed and Held  Hemoglobin A1c  Once,   R       Comments: To assess prior glycemic control    Signed and Held   Signed and Held  TSH  Once,   R        Signed and Held            Vitals/Pain Today's Vitals   09/01/22 2034 09/01/22 2035 09/01/22 2036  BP:   99/79  Pulse: 92    Resp: 16    Temp: 98 F (36.7 C)    TempSrc: Oral    SpO2: 99%    Weight:  80.7 kg   Height:  5\' 4"  (1.626 m)   PainSc:  0-No pain     Isolation Precautions No active isolations  Medications Medications - No data to  display  Mobility walks     Focused Assessments Neuro Assessment Handoff:  Swallow screen pass? Yes          Neuro Assessment: Exceptions to WDL (Pt reporting left leg weakness onset around 10 am today. Reports LKW around 2300 last night. States leaning towards right side all day and unable to get up without assist. Denies recent falls. No head injuries. No hx of stroke.) Neuro Checks:      Has TPA been given? No If patient is a Neuro Trauma and patient is going to OR before floor call report to 4N Charge nurse: [phone] or [phone]   R Recommendations: See Admitting Provider Note  Report given to:   Additional Notes: Left sided weakness. New onset this am around 10 am. LKW 2300 last night. No recent falls.

## 2022-09-01 NOTE — ED Provider Notes (Signed)
[place] EMERGENCY DEPARTMENT AT [facility] Provider Note   CSN: [id number] Arrival date & time: 09/01/22  2030     History  Chief Complaint  Patient presents with   Extremity Weakness    Left     Rachel Wilcox is a 72 y.o. female.  72 yo F with a chief complaints of left-sided weakness.  She noticed this when she was getting up this morning she thinks about 10 or 10:30 AM.  She was able to get up in the middle the night but she is not sure what time and at that point was fine.  She has had trouble getting up and walking today has trouble with her left leg and feels like she is leaning to the left.  She denies head injury denies recent illness denies headache or neck pain.   Extremity Weakness       Home Medications Prior to Admission medications   Medication Sig Start Date End Date Taking? Authorizing Provider  amLODipine (NORVASC) 10 MG tablet Take 10 mg by mouth daily. 10/27/20   [provider]  aspirin 81 MG tablet Take 81 mg by mouth daily.    [provider]  cefUROXime (CEFTIN) 500 MG tablet Take 1 tablet (500 mg total) by mouth 2 (two) times daily with a meal. 11/17/20   Brock Bad, MD  cyclobenzaprine (FLEXERIL) 10 MG tablet Take 1 tablet (10 mg total) by mouth 2 (two) times daily as needed for muscle spasms. Patient not taking: No sig reported 09/27/19   Bing Neighbors, NP  glimepiride (AMARYL) 4 MG tablet Take 4 mg by mouth 2 (two) times daily.     [provider]  HYDROcodone-acetaminophen (NORCO) 5-325 MG per tablet Take 1 tablet by mouth every 6 (six) hours as needed for severe pain. Patient not taking: No sig reported 10/20/13   Street, [address], [place]  ibuprofen (ADVIL) 800 MG tablet Take 1 tablet (800 mg total) by mouth every 12 (twelve) hours as needed. for pain 11/17/20   Brock Bad, MD  latanoprost (XALATAN) 0.005 % ophthalmic solution Place 1 drop into both eyes at bedtime. Patient not taking:  Reported on 11/17/2020    [provider]  lisinopril (ZESTRIL) 20 MG tablet Take 20 mg by mouth daily. 10/27/20   [provider]  meloxicam (MOBIC) 15 MG tablet Take 1 tablet (15 mg total) by mouth daily. Patient not taking: No sig reported 09/27/19   Bing Neighbors, NP  methocarbamol (ROBAXIN) 500 MG tablet Take 1 tablet (500 mg total) by mouth 2 (two) times daily. Patient not taking: No sig reported 05/13/16   Khatri, Hina, PA-C  NON FORMULARY Blood pressure medicine. Cannot state the name of drugs Patient not taking: No sig reported    [provider]  PredniSONE 10 MG KIT 6 day dose pack po Patient not taking: No sig reported 10/30/13   Rodolph Bong, MD  traMADol (ULTRAM) 50 MG tablet Take 1 tablet (50 mg total) by mouth every 6 (six) hours as needed. Patient not taking: No sig reported 10/23/13   Autumn Messing H, PA-C  Travoprost, BAK Free, (TRAVATAN) 0.004 % SOLN ophthalmic solution Place 1 drop into both eyes at bedtime. 02/12/16   [provider]      Allergies    Latex    Review of Systems   Review of Systems  Musculoskeletal:  Positive for extremity weakness.    Physical Exam Updated Vital Signs BP  99/79   Pulse 92   Temp 98 F (36.7 C) (Oral)   Resp 16   Ht 5\' 4"  (1.626 m)   Wt 80.7 kg   SpO2 99%   BMI 30.55 kg/m  Physical Exam Vitals and nursing note reviewed.  Constitutional:      General: She is not in acute distress.    Appearance: She is well-developed. She is not diaphoretic.  HENT:     Head: Normocephalic and atraumatic.  Eyes:     Pupils: Pupils are equal, round, and reactive to light.  Cardiovascular:     Rate and Rhythm: Normal rate and regular rhythm.     Heart sounds: No murmur heard.    No friction rub. No gallop.  Pulmonary:     Effort: Pulmonary effort is normal.     Breath sounds: No wheezing or rales.  Abdominal:     General: There is no distension.     Palpations: Abdomen is soft.     Tenderness:  There is no abdominal tenderness.  Musculoskeletal:        General: No tenderness.     Cervical back: Normal range of motion and neck supple.  Skin:    General: Skin is warm and dry.  Neurological:     Mental Status: She is alert and oriented to person, place, and time.     Cranial Nerves: Cranial nerves 2-12 are intact.     Sensory: Sensation is intact.     Comments: Patient has 4-5 muscle strength to the left upper extremity and 3 out of 5 muscle strength the left lower extremity..  5 out of 5 on the right.  Psychiatric:        Behavior: Behavior normal.     ED Results / Procedures / Treatments   Labs (all labs ordered are listed, but only abnormal results are displayed) Labs Reviewed  COMPREHENSIVE METABOLIC PANEL - Abnormal; Notable for the following components:      Result Value   Potassium 3.2 (*)    Glucose, Bld 123 (*)    Albumin 3.2 (*)    Total Bilirubin 0.2 (*)    All other components within normal limits  URINALYSIS, ROUTINE W REFLEX MICROSCOPIC - Abnormal; Notable for the following components:   APPearance HAZY (*)    Hgb urine dipstick SMALL (*)    All other components within normal limits  I-STAT CHEM 8, ED - Abnormal; Notable for the following components:   Potassium 3.1 (*)    Glucose, Bld 122 (*)    Calcium, Ion 1.08 (*)    All other components within normal limits  ETHANOL  PROTIME-INR  APTT  CBC  DIFFERENTIAL  RAPID URINE DRUG SCREEN, HOSP PERFORMED    EKG EKG Interpretation Date/Time:  Friday September 01 2022 20:39:31 EDT Ventricular Rate:  91 PR Interval:  176 QRS Duration:  96 QT Interval:  413 QTC Calculation: 509 R Axis:   -17  Text Interpretation: Sinus rhythm LAE, consider biatrial enlargement Borderline left axis deviation Anterior infarct, old Prolonged QT interval No significant change since last tracing Confirmed by Melene Plan [phone]) on 09/01/2022 8:55:23 PM  Radiology CT HEAD WO CONTRAST  Result Date: 09/01/2022 CLINICAL DATA:   Extremity weakness EXAM: CT HEAD WITHOUT CONTRAST TECHNIQUE: Contiguous axial images were obtained from the base of the skull through the vertex without intravenous contrast. RADIATION DOSE REDUCTION: This exam was performed according to the departmental dose-optimization program which includes automated exposure control, adjustment of the mA  and/or kV according to patient size and/or use of iterative reconstruction technique. COMPARISON:  None Available. FINDINGS: Brain: No evidence of acute infarction, hemorrhage, hydrocephalus, extra-axial collection or mass lesion/mass effect. Vascular: No hyperdense vessel or unexpected calcification. Skull: Normal. Negative for fracture or focal lesion. Sinuses/Orbits: No acute finding. Other: None. IMPRESSION: No acute intracranial abnormality noted. Electronically Signed   By: Alcide Clever M.D.   On: 09/01/2022 21:55    Procedures Procedures    Medications Ordered in ED Medications - No data to display  ED Course/ Medical Decision Making/ A&P                                 Medical Decision Making Amount and/or Complexity of Data Reviewed Labs: ordered. Radiology: ordered.  Risk Decision regarding hospitalization.   72 yo F with a chief complaints of left-sided weakness.  This started about 10 or 1030 this morning.  She has significant weakness on my exam.  Concern for stroke.  Will obtain a CT of the head.  Discussed with neurology. Dr. Wilford Corner, recommends admission.  Discussed with medicine.   No significant electrolyte abnormalities no significant anemia.  The patients results and plan were reviewed and discussed.   Any x-rays performed were independently reviewed by myself.   Differential diagnosis were considered with the presenting HPI.  Medications - No data to display  Vitals:   09/01/22 2034 09/01/22 2035 09/01/22 2036  BP:   99/79  Pulse: 92    Resp: 16    Temp: 98 F (36.7 C)    TempSrc: Oral    SpO2: 99%    Weight:  80.7 kg    Height:  5\' 4"  (1.626 m)     Final diagnoses:  Left-sided weakness    Admission/ observation were discussed with the admitting physician, patient and/or family and they are comfortable with the plan.          Final Clinical Impression(s) / ED Diagnoses Final diagnoses:  Left-sided weakness    Rx / DC Orders ED Discharge Orders     None         Melene Plan, DO 09/01/22 2257

## 2022-09-01 NOTE — H&P (Signed)
[facility] - [place] @ [facility] Admission History and Physical [employer], D.O.  ---------------------------------------------------------------------------------------------------------------------   PATIENT NAME: Rachel Wilcox MR#: [id number] DATE OF BIRTH: 1950/09/03 DATE OF ADMISSION: 09/01/2022 PRIMARY CARE PHYSICIAN: Wilmer Floor, NP  REQUESTING/REFERRING PHYSICIAN: ED Dr. Adela Lank  CHIEF COMPLAINT: Chief Complaint  Patient presents with   Extremity Weakness    Left     HISTORY OF PRESENT ILLNESS: [name] is a 72 y.o. female with a known history of CHF, diabetes, arthritis and hypertension was in a usual state of health until morning when she reports the onset of left-sided weakness associated gait ataxia, leaning to the left and weakness in her left leg. Last known well  Otherwise there has been no change in status. Patient has been taking medication as prescribed and there has been no recent change in medication or diet.  There has been no recent illness, travel or sick contacts.    Patient denies fevers/chills, weakness, dizziness, chest pain, shortness of breath, N/V/C/D, abdominal pain, dysuria/frequency, changes in mental status.   EMS/ED COURSE: MRI has been ordered neurology consulted, admission has been requested for stroke workup   PAST MEDICAL HISTORY: Past Medical History:  Diagnosis Date   Arthritis    CHF (congestive heart failure) (HCC)    Diabetes mellitus    Glaucoma    Hypertension       PAST SURGICAL HISTORY: Past Surgical History:  Procedure Laterality Date   CESAREAN SECTION     WISDOM TOOTH EXTRACTION        SOCIAL HISTORY: Social History   Tobacco Use   Smoking status: Some Days    Current packs/day: 0.50    Types: Cigarettes   Smokeless tobacco: Never  Substance Use Topics   Alcohol use: Yes    Comment: rarely      FAMILY HISTORY: No family history on file.   MEDICATIONS AT HOME: Prior to Admission  medications   Medication Sig Start Date End Date Taking? Authorizing Provider  amLODipine (NORVASC) 10 MG tablet Take 10 mg by mouth daily. 10/27/20   [provider]  aspirin 81 MG tablet Take 81 mg by mouth daily.    [provider]  cefUROXime (CEFTIN) 500 MG tablet Take 1 tablet (500 mg total) by mouth 2 (two) times daily with a meal. 11/17/20   Brock Bad, MD  cyclobenzaprine (FLEXERIL) 10 MG tablet Take 1 tablet (10 mg total) by mouth 2 (two) times daily as needed for muscle spasms. Patient not taking: No sig reported 09/27/19   Bing Neighbors, NP  glimepiride (AMARYL) 4 MG tablet Take 4 mg by mouth 2 (two) times daily.     [provider]  HYDROcodone-acetaminophen (NORCO) 5-325 MG per tablet Take 1 tablet by mouth every 6 (six) hours as needed for severe pain. Patient not taking: No sig reported 10/20/13   Street, [place], [place]  ibuprofen (ADVIL) 800 MG tablet Take 1 tablet (800 mg total) by mouth every 12 (twelve) hours as needed. for pain 11/17/20   Brock Bad, MD  latanoprost (XALATAN) 0.005 % ophthalmic solution Place 1 drop into both eyes at bedtime. Patient not taking: Reported on 11/17/2020    [provider]  lisinopril (ZESTRIL) 20 MG tablet Take 20 mg by mouth daily. 10/27/20   [provider]  meloxicam (MOBIC) 15 MG tablet Take 1 tablet (15 mg total) by mouth daily. Patient not taking: No sig reported 09/27/19   Bing Neighbors, NP  methocarbamol (ROBAXIN)  500 MG tablet Take 1 tablet (500 mg total) by mouth 2 (two) times daily. Patient not taking: No sig reported 05/13/16   Khatri, Hina, PA-C  NON FORMULARY Blood pressure medicine. Cannot state the name of drugs Patient not taking: No sig reported    [provider]  PredniSONE 10 MG KIT 6 day dose pack po Patient not taking: No sig reported 10/30/13   Rodolph Bong, MD  traMADol (ULTRAM) 50 MG tablet Take 1 tablet (50 mg total) by mouth every 6 (six) hours  as needed. Patient not taking: No sig reported 10/23/13   Autumn Messing H, PA-C  Travoprost, BAK Free, (TRAVATAN) 0.004 % SOLN ophthalmic solution Place 1 drop into both eyes at bedtime. 02/12/16   [provider]      DRUG ALLERGIES: Allergies  Allergen Reactions   Latex Rash     REVIEW OF SYSTEMS: CONSTITUTIONAL: No fever/chills, fatigue, weakness, weight gain/loss, headache EYES: No blurry or double vision. ENT: No tinnitus, postnasal drip, redness or soreness of the oropharynx. RESPIRATORY: No cough, wheeze, hemoptysis, dyspnea. CARDIOVASCULAR: No chest pain, orthopnea, palpitations, syncope. GASTROINTESTINAL: No nausea, vomiting, constipation, diarrhea, abdominal pain, hematemesis, melena or hematochezia. GENITOURINARY: No dysuria or hematuria. ENDOCRINE: No polyuria or nocturia. No heat or cold intolerance. HEMATOLOGY: No anemia, bruising, bleeding. INTEGUMENTARY: No rashes, ulcers, lesions. MUSCULOSKELETAL: No arthritis, swelling, gout. NEUROLOGIC: Positive weakness on the left PSYCHIATRIC: No anxiety, depression, insomnia.  PHYSICAL EXAMINATION: VITAL SIGNS: Blood pressure 99/79, pulse 92, temperature 98 F (36.7 C), temperature source Oral, resp. rate 16, height 5\' 4"  (1.626 m), weight 80.7 kg, SpO2 99%.  GENERAL: 72 y.o.-year-old female patient, well-developed, well-nourished lying in the bed in no acute distress.  Pleasant and cooperative.   HEENT: Head atraumatic, normocephalic. Pupils equal, round, reactive to light and accommodation. No scleral icterus. Extraocular muscles intact. Nares are patent. Oropharynx is clear. Mucus membranes moist. NECK: Supple, full range of motion. No JVD, no bruit heard. No thyroid enlargement, no tenderness, no cervical lymphadenopathy. CHEST: Normal breath sounds bilaterally. No wheezing, rales, rhonchi or crackles. No use of accessory muscles of respiration.  No reproducible chest wall tenderness.  CARDIOVASCULAR: S1, S2  normal. No murmurs, rubs, or gallops. Cap refill <2 seconds. ABDOMEN: Soft, nontender, nondistended. No rebound, guarding, rigidity. Normoactive bowel sounds present in all four quadrants. No organomegaly or mass. EXTREMITIES: Full range of motion. No pedal edema, cyanosis, or clubbing. NEUROLOGIC: Cranial nerves II through XII are grossly intact with no focal sensorimotor deficit. Muscle strength 5/5 in all extremities. Sensation intact. Gait not checked. PSYCHIATRIC: The patient is alert and oriented x 3. Normal affect, mood, thought content. SKIN: Warm, dry, and intact without obvious rash, lesion, or ulcer.  LABORATORY PANEL:  CBC Recent Labs  Lab 09/01/22 2109 09/01/22 2121  WBC 5.3  --   HGB 14.0 14.3  HCT 42.6 42.0  PLT 203  --    ----------------------------------------------------------------------------------------------------------------- Chemistries Recent Labs  Lab 09/01/22 2109 09/01/22 2121  NA 138 139  K 3.2* 3.1*  CL 99 101  CO2 24  --   GLUCOSE 123* 122*  BUN 14 14  CREATININE 0.61 0.60  CALCIUM 9.1  --   AST 17  --   ALT 12  --   ALKPHOS 95  --   BILITOT 0.2*  --    ------------------------------------------------------------------------------------------------------------------ Cardiac Enzymes No results for input(s): "TROPONINI" in the last 168 hours. ------------------------------------------------------------------------------------------------------------------  RADIOLOGY: CT HEAD WO CONTRAST  Result Date: 09/01/2022 CLINICAL DATA:  Extremity weakness  EXAM: CT HEAD WITHOUT CONTRAST TECHNIQUE: Contiguous axial images were obtained from the base of the skull through the vertex without intravenous contrast. RADIATION DOSE REDUCTION: This exam was performed according to the departmental dose-optimization program which includes automated exposure control, adjustment of the mA and/or kV according to patient size and/or use of iterative reconstruction  technique. COMPARISON:  None Available. FINDINGS: Brain: No evidence of acute infarction, hemorrhage, hydrocephalus, extra-axial collection or mass lesion/mass effect. Vascular: No hyperdense vessel or unexpected calcification. Skull: Normal. Negative for fracture or focal lesion. Sinuses/Orbits: No acute finding. Other: None. IMPRESSION: No acute intracranial abnormality noted. Electronically Signed   By: Alcide Clever M.D.   On: 09/01/2022 21:55    EKG: Sinus at 91bpm with LAD, LAE, prolonged QT, nonspecific ST T wave changes.   IMPRESSION AND PLAN:  This is a 72 y.o. female with a history of CHF, diabetes, arthritis and hypertension now being admitted with:  1.  Left-sided weakness evaluate for CVA - Admit telemetry observation for neuro workup including: - Studies: MRA/MRI, Echo, Carotids - Labs: CBC, BMP, Lipids, TFTs, A1C - Nursing: Neurochecks, O2, dysphagia screen, permissive hypertension.  - Consults: Neurology, PT/OT, S/S consults.  - Meds: Daily aspirin 81mg .   - Fluids: IVNS@75cc /hr.   - Routine DVT Px: with Lovenox, SCDs, early ambulation  2.  Mild hypokalemia - Replace orally - Check magnesium level  3.  History of hypertension - Hold amlodipine and lisinopril for now  4. H/o Diabetes - Accuchecks achs with RISS coverage - Heart healthy, carb controlled diet   Code Status: Full  All the records are reviewed and case discussed with ED provider. Management plans discussed with the patient and/or family who express understanding and agree with plan of care.   TOTAL TIME TAKING CARE OF THIS PATIENT: 60 minutes.   Benny Deutschman D.O. on 09/01/2022 at 10:40 PM CC: Primary care physician; Wilmer Floor, NP     Note: This dictation was prepared with Dragon dictation along with smaller phrase technology. Any transcriptional errors that result from this process are unintentional.

## 2022-09-01 NOTE — ED Notes (Signed)
Back from CT at this time.

## 2022-09-01 NOTE — Consult Note (Signed)
Neurology Consultation  Reason for Consult: Left-sided weakness Referring Physician: Dr. Adela Lank, ER provider  Chief complaint: Left-sided weakness  History is obtained from: Patient, chart  HPI: Rachel Wilcox is a 72 y.o. female with documented history of CHF (unclear diastolic or systolic) diabetes, hypertension, presented to the emergency department for evaluation of sudden onset of left-sided weakness and gait difficulty.  Last known well when she went to bed around 10 PM yesterday.  Woke up this morning and was unable to move the left side well.  Was unable to walk and was soaring to the right.  Unable to support weight on the left.  Came to the ER for further evaluation.  Noncontrast head CT completed with no acute changes. Clinical suspicion for stroke for which she is being recommended an MRI which has been ordered and will be admitted for therapy evaluations as well as stroke workup. Denies prior history of stroke. Reports anxiety and reservations against staying in the hospital-feels paranoid in the hospital. Denies chest pain shortness of breath.  Denies vision changes.  Denies headache.  Denies facial drooping or slurred speech.  Denies other neurological symptoms.  Denies any recent sick contacts.  LKW: 10 PM on 08/31/2022 IV thrombolysis given?: no, outside the window EVT: No-exam more likely small vessel etiology, also outside the window Premorbid modified Rankin scale (mRS):0  ROS: Full ROS was performed and is negative except as noted in the HPI.   Past Medical History:  Diagnosis Date   Arthritis    CHF (congestive heart failure) (HCC)    Diabetes mellitus    Glaucoma    Hypertension    No family history on file.  Social History:   reports that she has been smoking cigarettes. She has never used smokeless tobacco. She reports current alcohol use. She reports that she does not use drugs.  Medications No current facility-administered medications for this  encounter.  Current Outpatient Medications:    amLODipine (NORVASC) 10 MG tablet, Take 10 mg by mouth daily., Disp: , Rfl:    aspirin 81 MG tablet, Take 81 mg by mouth daily., Disp: , Rfl:    cefUROXime (CEFTIN) 500 MG tablet, Take 1 tablet (500 mg total) by mouth 2 (two) times daily with a meal., Disp: 14 tablet, Rfl: 0   cyclobenzaprine (FLEXERIL) 10 MG tablet, Take 1 tablet (10 mg total) by mouth 2 (two) times daily as needed for muscle spasms. (Patient not taking: No sig reported), Disp: 20 tablet, Rfl: 0   glimepiride (AMARYL) 4 MG tablet, Take 4 mg by mouth 2 (two) times daily. , Disp: , Rfl:    HYDROcodone-acetaminophen (NORCO) 5-325 MG per tablet, Take 1 tablet by mouth every 6 (six) hours as needed for severe pain. (Patient not taking: No sig reported), Disp: 6 tablet, Rfl: 0   ibuprofen (ADVIL) 800 MG tablet, Take 1 tablet (800 mg total) by mouth every 12 (twelve) hours as needed. for pain, Disp: 30 tablet, Rfl: 2   latanoprost (XALATAN) 0.005 % ophthalmic solution, Place 1 drop into both eyes at bedtime. (Patient not taking: Reported on 11/17/2020), Disp: , Rfl:    lisinopril (ZESTRIL) 20 MG tablet, Take 20 mg by mouth daily., Disp: , Rfl:    meloxicam (MOBIC) 15 MG tablet, Take 1 tablet (15 mg total) by mouth daily. (Patient not taking: No sig reported), Disp: 30 tablet, Rfl: 1   methocarbamol (ROBAXIN) 500 MG tablet, Take 1 tablet (500 mg total) by mouth 2 (two) times daily. (Patient  not taking: No sig reported), Disp: 20 tablet, Rfl: 0   NON FORMULARY, Blood pressure medicine. Cannot state the name of drugs (Patient not taking: No sig reported), Disp: , Rfl:    PredniSONE 10 MG KIT, 6 day dose pack po (Patient not taking: No sig reported), Disp: 1 kit, Rfl: 0   traMADol (ULTRAM) 50 MG tablet, Take 1 tablet (50 mg total) by mouth every 6 (six) hours as needed. (Patient not taking: No sig reported), Disp: 15 tablet, Rfl: 0   Travoprost, BAK Free, (TRAVATAN) 0.004 % SOLN ophthalmic  solution, Place 1 drop into both eyes at bedtime., Disp: , Rfl:   Exam: Current vital signs: BP 99/79   Pulse 92   Temp 98 F (36.7 C) (Oral)   Resp 16   Ht 5\' 4"  (1.626 m)   Wt 80.7 kg   SpO2 99%   BMI 30.55 kg/m  Vital signs in last 24 hours: Temp:  [98 F (36.7 C)] 98 F (36.7 C) (08/23 2034) Pulse Rate:  [92] 92 (08/23 2034) Resp:  [16] 16 (08/23 2034) BP: (99)/(79) 99/79 (08/23 2036) SpO2:  [99 %] 99 % (08/23 2034) Weight:  [80.7 kg] 80.7 kg (08/23 2035) General: Awake alert in no distress HEENT: Normocephalic atraumatic Lungs: Clear Cardiovascular: Regular rhythm Abdomen nondistended nontender Extremities warm well-perfused Neurological exam Awake alert oriented x 3 No dysarthria No aphasia Cranial nerves II to XII intact Motor examination with 3/5 left upper extremity, 1-2/5 left lower extremity.  Right side full strength. Sensation diminished on the left in comparison to the right Coordination intact on the right, unable to assess on the left NIH stroke scale 1a Level of Conscious.: 0 1b LOC Questions: 0 1c LOC Commands: 0 2 Best Gaze: 0 3 Visual: 0 4 Facial Palsy: 0 5a Motor Arm - left: 1 5b Motor Arm - Right: 0 6a Motor Leg - Left: 3 6b Motor Leg - Right: 0 7 Limb Ataxia: 0 8 Sensory: 2 9 Best Language: 0 10 Dysarthria: 0 11 Extinct. and Inatten.: 0 TOTAL: 6   Labs I have reviewed labs in epic and the results pertinent to this consultation are: CBC    Component Value Date/Time   WBC 5.3 09/01/2022 2109   RBC 4.74 09/01/2022 2109   HGB 14.3 09/01/2022 2121   HCT 42.0 09/01/2022 2121   PLT 203 09/01/2022 2109   MCV 89.9 09/01/2022 2109   MCH 29.5 09/01/2022 2109   MCHC 32.9 09/01/2022 2109   RDW 14.7 09/01/2022 2109   LYMPHSABS 2.6 09/01/2022 2109   MONOABS 0.7 09/01/2022 2109   EOSABS 0.1 09/01/2022 2109   BASOSABS 0.0 09/01/2022 2109    CMP     Component Value Date/Time   NA 139 09/01/2022 2121   K 3.1 (L) 09/01/2022 2121   CL  101 09/01/2022 2121   CO2 24 09/01/2022 2109   GLUCOSE 122 (H) 09/01/2022 2121   BUN 14 09/01/2022 2121   CREATININE 0.60 09/01/2022 2121   CALCIUM 9.1 09/01/2022 2109   PROT 7.1 09/01/2022 2109   ALBUMIN 3.2 (L) 09/01/2022 2109   AST 17 09/01/2022 2109   ALT 12 09/01/2022 2109   ALKPHOS 95 09/01/2022 2109   BILITOT 0.2 (L) 09/01/2022 2109   GFRNONAA >60 09/01/2022 2109   GFRAA >60 12/15/2014 1648   Imaging I have reviewed the images obtained: CT-head-no acute changes  Assessment:  72 year old woman with above past medical history presenting for inability to move the left side as well  as the right side since this morning with last known well at 2200 hrs. yesterday.  On examination has left hemiparesis and left hemisensory loss. Clinically appears to have suffered a stroke-likely small vessel etiology versus cardioembolic source. Outside the window for thrombolysis or thrombectomy  Impression: Acute ischemic stroke  Recommendations: Admit to hospitalist Frequent neurochecks Telemetry CT angio head and neck 2D echo A1c Lipid panel Aspirin 81+ Plavix 75 PT OT Speech therapy Allow for permissive hypertension-treat only if systolic blood pressures greater than 220 on a as needed basis for the next 24 to 48 hours.  Eventual goal on discharge should be normotension.  That said, avoid hypotension at all times and keep systolic blood pressure greater than 100.  Plan discussed with the patient, spouse and Dr. Melene Plan in the emergency department.   -- Milon Dikes, MD Neurologist [facility] Pager: [phone]

## 2022-09-01 NOTE — ED Triage Notes (Signed)
Pt BIB EMS from home w/ complaint of left sided weakness onset this am around 10 am. Reports LKW 2200. Pt also states speech not at baseline. States left leg weak and unable to stand or walk without leaning to the right.     BP 140/70 HR 86 CBG 152 SpO2 97% RA

## 2022-09-02 ENCOUNTER — Observation Stay (HOSPITAL_COMMUNITY): Payer: Medicare Other

## 2022-09-02 ENCOUNTER — Other Ambulatory Visit (HOSPITAL_COMMUNITY): Payer: Medicare Other

## 2022-09-02 ENCOUNTER — Inpatient Hospital Stay (HOSPITAL_COMMUNITY): Payer: Medicare Other

## 2022-09-02 ENCOUNTER — Encounter (HOSPITAL_COMMUNITY): Payer: Medicare Other

## 2022-09-02 DIAGNOSIS — G8194 Hemiplegia, unspecified affecting left nondominant side: Secondary | ICD-10-CM | POA: Diagnosis not present

## 2022-09-02 DIAGNOSIS — I10 Essential (primary) hypertension: Secondary | ICD-10-CM

## 2022-09-02 DIAGNOSIS — R531 Weakness: Secondary | ICD-10-CM | POA: Diagnosis present

## 2022-09-02 DIAGNOSIS — E119 Type 2 diabetes mellitus without complications: Secondary | ICD-10-CM

## 2022-09-02 DIAGNOSIS — Z791 Long term (current) use of non-steroidal anti-inflammatories (NSAID): Secondary | ICD-10-CM | POA: Diagnosis not present

## 2022-09-02 DIAGNOSIS — R262 Difficulty in walking, not elsewhere classified: Secondary | ICD-10-CM | POA: Diagnosis not present

## 2022-09-02 DIAGNOSIS — E785 Hyperlipidemia, unspecified: Secondary | ICD-10-CM | POA: Diagnosis not present

## 2022-09-02 DIAGNOSIS — R29706 NIHSS score 6: Secondary | ICD-10-CM | POA: Diagnosis not present

## 2022-09-02 DIAGNOSIS — Z7902 Long term (current) use of antithrombotics/antiplatelets: Secondary | ICD-10-CM | POA: Diagnosis not present

## 2022-09-02 DIAGNOSIS — I5042 Chronic combined systolic (congestive) and diastolic (congestive) heart failure: Secondary | ICD-10-CM | POA: Diagnosis not present

## 2022-09-02 DIAGNOSIS — Z794 Long term (current) use of insulin: Secondary | ICD-10-CM | POA: Diagnosis not present

## 2022-09-02 DIAGNOSIS — I6389 Other cerebral infarction: Secondary | ICD-10-CM | POA: Diagnosis not present

## 2022-09-02 DIAGNOSIS — Z7984 Long term (current) use of oral hypoglycemic drugs: Secondary | ICD-10-CM | POA: Diagnosis not present

## 2022-09-02 DIAGNOSIS — Z79899 Other long term (current) drug therapy: Secondary | ICD-10-CM | POA: Diagnosis not present

## 2022-09-02 DIAGNOSIS — Z6830 Body mass index (BMI) 30.0-30.9, adult: Secondary | ICD-10-CM | POA: Diagnosis not present

## 2022-09-02 DIAGNOSIS — F17210 Nicotine dependence, cigarettes, uncomplicated: Secondary | ICD-10-CM | POA: Diagnosis not present

## 2022-09-02 DIAGNOSIS — I639 Cerebral infarction, unspecified: Secondary | ICD-10-CM | POA: Insufficient documentation

## 2022-09-02 DIAGNOSIS — Z91040 Latex allergy status: Secondary | ICD-10-CM | POA: Diagnosis not present

## 2022-09-02 DIAGNOSIS — I110 Hypertensive heart disease with heart failure: Secondary | ICD-10-CM | POA: Diagnosis not present

## 2022-09-02 DIAGNOSIS — E669 Obesity, unspecified: Secondary | ICD-10-CM | POA: Diagnosis not present

## 2022-09-02 DIAGNOSIS — E876 Hypokalemia: Secondary | ICD-10-CM | POA: Diagnosis not present

## 2022-09-02 DIAGNOSIS — Z7982 Long term (current) use of aspirin: Secondary | ICD-10-CM | POA: Diagnosis not present

## 2022-09-02 LAB — GLUCOSE, CAPILLARY
Glucose-Capillary: 192 mg/dL — ABNORMAL HIGH (ref 70–99)
Glucose-Capillary: 215 mg/dL — ABNORMAL HIGH (ref 70–99)
Glucose-Capillary: 133 mg/dL — ABNORMAL HIGH (ref 70–99)
Glucose-Capillary: 105 mg/dL — ABNORMAL HIGH (ref 70–99)

## 2022-09-02 LAB — CBC
HCT: 42.7 % (ref 36.0–46.0)
Hemoglobin: 13.6 g/dL (ref 12.0–15.0)
MCH: 27.8 pg (ref 26.0–34.0)
MCHC: 31.9 g/dL (ref 30.0–36.0)
MCV: 87.3 fL (ref 80.0–100.0)
Platelets: 213 10*3/uL (ref 150–400)
RBC: 4.89 MIL/uL (ref 3.87–5.11)
RDW: 14.8 % (ref 11.5–15.5)
WBC: 5.5 10*3/uL (ref 4.0–10.5)
nRBC: 0 % (ref 0.0–0.2)

## 2022-09-02 LAB — BASIC METABOLIC PANEL
Anion gap: 6 (ref 5–15)
BUN: 12 mg/dL (ref 8–23)
CO2: 32 mmol/L (ref 22–32)
Calcium: 9 mg/dL (ref 8.9–10.3)
Chloride: 100 mmol/L (ref 98–111)
Creatinine, Ser: 0.62 mg/dL (ref 0.44–1.00)
GFR, Estimated: >60 mL/min (ref >60)
Glucose, Bld: 127 mg/dL — ABNORMAL HIGH (ref 70–99)
Potassium: 3.2 mmol/L — ABNORMAL LOW (ref 3.5–5.1)
Sodium: 138 mmol/L (ref 135–145)

## 2022-09-02 LAB — LIPID PANEL
Cholesterol: 189 mg/dL (ref 0–200)
HDL: 38 mg/dL — ABNORMAL LOW (ref >40)
LDL Cholesterol: 119 mg/dL — ABNORMAL HIGH (ref 0–99)
Total CHOL/HDL Ratio: 5 RATIO
Triglycerides: 162 mg/dL — ABNORMAL HIGH (ref <150)
VLDL: 32 mg/dL (ref 0–40)

## 2022-09-02 LAB — TSH: TSH: 2.284 u[IU]/mL (ref 0.350–4.500)

## 2022-09-02 LAB — HEMOGLOBIN A1C
Hgb A1c MFr Bld: 7.4 % — ABNORMAL HIGH (ref 4.8–5.6)
Mean Plasma Glucose: 165.68 mg/dL

## 2022-09-02 LAB — CREATININE, SERUM
Creatinine, Ser: 0.64 mg/dL (ref 0.44–1.00)
GFR, Estimated: >60 mL/min (ref >60)

## 2022-09-02 LAB — MAGNESIUM: Magnesium: 1.9 mg/dL (ref 1.7–2.4)

## 2022-09-02 LAB — HIV ANTIBODY (ROUTINE TESTING W REFLEX): HIV Screen 4th Generation wRfx: NONREACTIVE

## 2022-09-02 MED ORDER — LORAZEPAM 0.5 MG PO TABS: 0.5000 mg | ORAL_TABLET | Freq: Once | ORAL | Status: DC | PRN

## 2022-09-02 MED ORDER — INSULIN ASPART 100 UNIT/ML IJ SOLN: 0.0000 [IU] | INTRAMUSCULAR | Status: DC

## 2022-09-02 MED ORDER — LATANOPROST 0.005 % OP SOLN
1.0000 [drp] | Freq: Every day | OPHTHALMIC | Status: DC
  Filled 2022-09-02: qty 2.5

## 2022-09-02 MED ORDER — SODIUM CHLORIDE 0.9 % IV SOLN: INTRAVENOUS | Status: DC

## 2022-09-02 MED ORDER — SENNOSIDES-DOCUSATE SODIUM 8.6-50 MG PO TABS: 1.0000 | ORAL_TABLET | Freq: Every evening | ORAL | Status: DC | PRN

## 2022-09-02 MED ORDER — POTASSIUM CHLORIDE 10 MEQ/100ML IV SOLN
10.0000 meq | INTRAVENOUS | Status: AC
  Filled 2022-09-02: qty 100

## 2022-09-02 MED ORDER — ACETAMINOPHEN 160 MG/5ML PO SOLN: 650.0000 mg | ORAL | Status: DC | PRN

## 2022-09-02 MED ORDER — ACETAMINOPHEN 650 MG RE SUPP: 650.0000 mg | RECTAL | Status: DC | PRN

## 2022-09-02 MED ORDER — ASPIRIN 81 MG PO TBEC
81.0000 mg | DELAYED_RELEASE_TABLET | Freq: Every day | ORAL | Status: DC
  Administered 2022-09-02: 81 mg via ORAL
  Filled 2022-09-02: qty 1

## 2022-09-02 MED ORDER — ENOXAPARIN SODIUM 40 MG/0.4ML IJ SOSY
40.0000 mg | PREFILLED_SYRINGE | Freq: Every day | INTRAMUSCULAR | Status: DC
  Administered 2022-09-02: 40 mg via SUBCUTANEOUS
  Filled 2022-09-02: qty 0.4

## 2022-09-02 MED ORDER — CLOPIDOGREL BISULFATE 75 MG PO TABS
75.0000 mg | ORAL_TABLET | Freq: Every day | ORAL | Status: DC
  Administered 2022-09-02: 75 mg via ORAL
  Filled 2022-09-02: qty 1

## 2022-09-02 MED ORDER — NICOTINE 21 MG/24HR TD PT24
21.0000 mg | MEDICATED_PATCH | Freq: Every day | TRANSDERMAL | Status: DC
  Administered 2022-09-02: 21 mg via TRANSDERMAL
  Filled 2022-09-02: qty 1

## 2022-09-02 MED ORDER — POTASSIUM CHLORIDE CRYS ER 20 MEQ PO TBCR
40.0000 meq | EXTENDED_RELEASE_TABLET | Freq: Once | ORAL | Status: AC
  Administered 2022-09-02: 40 meq via ORAL
  Filled 2022-09-02: qty 2

## 2022-09-02 MED ORDER — NICOTINE 21 MG/24HR TD PT24: 21.0000 mg | MEDICATED_PATCH | Freq: Every day | TRANSDERMAL | Status: DC

## 2022-09-02 MED ORDER — ACETAMINOPHEN 325 MG PO TABS: 650.0000 mg | ORAL_TABLET | ORAL | Status: DC | PRN

## 2022-09-02 MED ORDER — STROKE: EARLY STAGES OF RECOVERY BOOK
Freq: Once | Status: AC
  Filled 2022-09-02: qty 1

## 2022-09-02 NOTE — Progress Notes (Signed)
Inpatient Rehab Admissions Coordinator Note:   Per PT/OT patient was screened for CIR candidacy by Phylisha Dix Luvenia Starch, CCC-SLP. At this time, pt appears to be a potential candidate for CIR. I will place an order for rehab consult for full assessment, per our protocol.  Please contact me any with questions.Wolfgang Phoenix, MS, CCC-SLP Admissions Coordinator [phone] 09/02/22 4:45 PM

## 2022-09-02 NOTE — Progress Notes (Addendum)
STROKE TEAM PROGRESS NOTE   BRIEF HPI 72 y.o. female with past medical history of CHF, diabetes, arthritis and hypertension was in a usual state of health until morning of presentation when she had acute onset of left-sided weakness with gait difficulty. Patient then presented to the ED for further evaluation.  MRI of the brain without contrast showing acute perforator infarct involving the right internal capsule and will lateral thalamus with punctuate infarct at the right insula.    SIGNIFICANT HOSPITAL EVENTS 8/24- admitted for stroke work up  INTERIM HISTORY/SUBJECTIVE Refusing IV access and telemetry despite education. Did go to MRI today after initially refusing. May need MRA head if she is refusing CTA or IV access.    OBJECTIVE  CBC    Component Value Date/Time   WBC 5.5 09/02/2022 0142   RBC 4.89 09/02/2022 0142   HGB 13.6 09/02/2022 0142   HCT 42.7 09/02/2022 0142   PLT 213 09/02/2022 0142   MCV 87.3 09/02/2022 0142   MCH 27.8 09/02/2022 0142   MCHC 31.9 09/02/2022 0142   RDW 14.8 09/02/2022 0142   LYMPHSABS 2.6 09/01/2022 2109   MONOABS 0.7 09/01/2022 2109   EOSABS 0.1 09/01/2022 2109   BASOSABS 0.0 09/01/2022 2109    BMET    Component Value Date/Time   NA 138 09/02/2022 0142   K 3.2 (L) 09/02/2022 0142   CL 100 09/02/2022 0142   CO2 32 09/02/2022 0142   GLUCOSE 127 (H) 09/02/2022 0142   BUN 12 09/02/2022 0142   CREATININE 0.64 09/02/2022 0142   CREATININE 0.62 09/02/2022 0142   CALCIUM 9.0 09/02/2022 0142   GFRNONAA >60 09/02/2022 0142   GFRNONAA >60 09/02/2022 0142    IMAGING past 24 hours MR BRAIN WO CONTRAST  Result Date: 09/02/2022 CLINICAL DATA:  Acute stroke suspected EXAM: MRI HEAD WITHOUT CONTRAST TECHNIQUE: Multiplanar, multiecho pulse sequences of the brain and surrounding structures were obtained without intravenous contrast. COMPARISON:  Head CT from yesterday FINDINGS: Brain: Wedge of restricted diffusion at the right internal capsule and  thalamus. Tiny area of acute cortical infarct at the posterior right insula. No acute hemorrhage, hydrocephalus, mass, or collection. Vascular: Grossly preserved flow voids Skull and upper cervical spine: No gross marrow lesion Sinuses/Orbits: Unremarkable. Other: Intermittently significant motion artifact. IMPRESSION: 1. Acute perforator infarct involving the right internal capsule and lateral thalamus. Punctate acute infarct at the right insula. 2. Intermittently significant motion artifact. Electronically Signed   By: Tiburcio Pea M.D.   On: 09/02/2022 12:40   DG Chest Port 1 View  Result Date: 09/02/2022 CLINICAL DATA:  Left-sided weakness EXAM: PORTABLE CHEST 1 VIEW COMPARISON:  06/13/2017 FINDINGS: Cardiac shadow is mildly prominent but accentuated by the portable technique. Aortic calcifications are noted. The lungs are clear. No bony abnormality is noted. IMPRESSION: No active disease. Electronically Signed   By: Alcide Clever M.D.   On: 09/02/2022 02:08   CT HEAD WO CONTRAST  Result Date: 09/01/2022 CLINICAL DATA:  Extremity weakness EXAM: CT HEAD WITHOUT CONTRAST TECHNIQUE: Contiguous axial images were obtained from the base of the skull through the vertex without intravenous contrast. RADIATION DOSE REDUCTION: This exam was performed according to the departmental dose-optimization program which includes automated exposure control, adjustment of the mA and/or kV according to patient size and/or use of iterative reconstruction technique. COMPARISON:  None Available. FINDINGS: Brain: No evidence of acute infarction, hemorrhage, hydrocephalus, extra-axial collection or mass lesion/mass effect. Vascular: No hyperdense vessel or unexpected calcification. Skull: Normal. Negative for fracture or  focal lesion. Sinuses/Orbits: No acute finding. Other: None. IMPRESSION: No acute intracranial abnormality noted. Electronically Signed   By: Alcide Clever M.D.   On: 09/01/2022 21:55    Vitals:   09/02/22  0329 09/02/22 0744 09/02/22 1110 09/02/22 1507  BP: 120/77 (!) 152/88 (!) 156/96 (!) 164/79  Pulse: 90 (!) 101 100 97  Resp: 19 19 18 18   Temp: 98 F (36.7 C) 98.4 F (36.9 C) 98.2 F (36.8 C) 97.7 F (36.5 C)  TempSrc:  Oral Oral Axillary  SpO2: 96% 98% 99% 100%  Weight:      Height:         PHYSICAL EXAM General:  Alert, well-nourished, well-developed patient in no acute distress Psych:  Mood and affect appropriate for situation CV: Regular rate and rhythm on monitor Respiratory:  Regular, unlabored respirations on room air GI: Abdomen soft and nontender   NEURO:  Mental Status: AA&Ox3, patient is able to give clear and coherent history Speech/Language: speech is without dysarthria or aphasia.  Naming, repetition, fluency, and comprehension intact.  Cranial Nerves:  II: PERRL. Visual fields full.  III, IV, VI: EOMI. Eyelids elevate symmetrically.  V: Sensation is intact to light touch and symmetrical to face.  VII: Face is symmetrical resting and smiling VIII: hearing intact to voice. IX, X: Palate elevates symmetrically. Phonation is normal.  ZO:[id number] shrug 5/5. XII: tongue is midline without fasciculations. Motor: 3/5 left upper extremity, 1-2/5 left lower extremity.  Right side full strength. 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 Punctate acute infarct at the right insula. Intermittently significant motion artifact. MRA vs CTA Head and Neck Carotid Doppler  - pending  2D Echo - pending LDL 119 HgbA1c 7.4 VTE prophylaxis - lovenox aspirin 325 mg daily prior to admission, now on aspirin  81 mg daily and clopidogrel 75 mg daily for 3 weeks and then plavix alone. Therapy recommendations:  pending  Disposition:  pending  Hypertension Home meds:  lisinopril Stable Blood Pressure Goal: BP less than 220/110   Hyperlipidemia Home meds:  none, resumed in hospital LDL 119, goal < 70 Add atorvastatin Continue statin at discharge  Diabetes type II Controlled Home meds:  glimepride HgbA1c 7.4, goal < 7.0 CBGs SSI Recommend close follow-up with PCP for better DM control  Tobacco Abuse Patient smokes 0.5 packs per day       Ready to quit? No Nicotine replacement therapy provided  Other Stroke Risk Factors ETOH use, alcohol level <10, advised to drink no more than 1 drink(s) a day Obesity, Body mass index is 30.55 kg/m., BMI >/= 30 associated with increased stroke risk, recommend weight loss, diet and exercise as appropriate   Other Active Problems Hypokalemia- replaced orally   Hospital day # 0  Patient seen and examined by NP/APP with MD. MD to update note as needed.   Elmer Picker, DNP, FNP-BC [facility] Pager: [phone]   ATTENDING NOTE: I reviewed above note and agree with the assessment and plan. Pt was seen and examined.  For detailed assessment and plan, please refer to above/below as I have made changes wherever appropriate. I spent a total of 35 minutes  in the care of this patient   Windell Norfolk, MD  Stroke Neurology 09/02/2022 3:39 PM     To contact Stroke Continuity provider, please refer to [email or web address]. After hours, contact General Neurology

## 2022-09-02 NOTE — Evaluation (Signed)
Physical Therapy Evaluation Patient Details Name: Rachel Wilcox MRN: [id number] DOB: 27-Jan-1950 Today's Date: 09/02/2022  History of Present Illness  72 y.o. female admitted 8/23 following acute onset of left-sided weakness with gait difficulty.   Imaging + Acute CVA. 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  Clinical Impression  Pt admitted with above diagnosis. PTA independent and active without need for an ambulatory assistive device. Lives with SO and son. Currently requires Min assist +2 for transfers and gait limited to about 6 feet with RW Mod A +2. Patient will benefit from intensive inpatient follow up therapy, >3 hours/day. Pt currently with functional limitations due to the deficits listed below (see PT Problem List). Pt will benefit from acute skilled PT to increase their independence and safety with mobility to allow discharge.           If plan is discharge home, recommend the following: Two people to help with walking and/or transfers;Two people to help with bathing/dressing/bathroom;Assistance with cooking/housework;Direct supervision/assist for medications management;Direct supervision/assist for financial management;Assist for transportation;Help with stairs or ramp for entrance;Supervision due to cognitive status   Can travel by private vehicle        Equipment Recommendations Other (comment) (TBD next venue)  Recommendations for Other Services  Rehab consult    Functional Status Assessment Patient has had a recent decline in their functional status and demonstrates the ability to make significant improvements in function in a reasonable and predictable amount of time.     Precautions / Restrictions Precautions Precautions: Fall Restrictions Weight Bearing Restrictions: No      Mobility  Bed Mobility Overal bed mobility: Needs  Assistance Bed Mobility: Sit to Supine, Supine to Sit     Supine to sit: Mod assist Sit to supine: Mod assist   General bed mobility comments: Mod assist for trunk and LEs, cues for technique.    Transfers Overall transfer level: Needs assistance Equipment used: Rolling walker (2 wheels), 2 person hand held assist Transfers: Sit to/from Stand Sit to Stand: Min assist, +2 safety/equipment, +2 physical assistance, Mod assist           General transfer comment: Min assist +2 to stand with first round. Mod assist +1 for second and third round with RW and again with BIL hand support. VC for technique and hand placement guided to bed with RUE to press.    Ambulation/Gait Ambulation/Gait assistance: +2 physical assistance, +2 safety/equipment, Mod assist Gait Distance (Feet): 6 Feet Assistive device: Rolling walker (2 wheels) Gait Pattern/deviations: Step-to pattern, Decreased stance time - left, Decreased stride length, Decreased dorsiflexion - left, Decreased weight shift to right, Knee hyperextension - left Gait velocity: decr Gait velocity interpretation: <1.31 ft/sec, indicative of household ambulator Pre-gait activities: Weight shift march General Gait Details: Up to mod assist +2 for RW control, Lt foot advancement, balance, and to facilitate weight shift and sequencing. Max VC. Poor advancement of LLE forward and worse with backwards step. Attempts to sit prematurely. easily fatigued.  Stairs            Wheelchair Mobility     Tilt Bed    Modified Rankin (Stroke Patients Only) Modified Rankin (Stroke Patients Only) Pre-Morbid Rankin Score: No symptoms Modified Rankin: Moderately severe disability     Balance Overall balance assessment: Needs assistance Sitting-balance support: Feet supported, Single extremity supported Sitting balance-Leahy Scale: Poor Sitting balance - Comments: Spontaneous LOB. Requires  CGA-min assist Postural control: Posterior lean, Left  lateral lean, Right lateral lean Standing balance support: Bilateral upper extremity supported Standing balance-Leahy Scale: Poor                               Pertinent Vitals/Pain Pain Assessment Pain Assessment: No/denies pain    Home Living Family/patient expects to be discharged to:: Private residence Living Arrangements: Spouse/significant other;Children Available Help at Discharge: Family Type of Home: House Home Access: Stairs to enter Entrance Stairs-Rails: None Entrance Stairs-Number of Steps: 2   Home Layout: One level Home Equipment: Cane - single Librarian, academic (2 wheels);Hand held shower head;BSC/3in1 Additional Comments: lives with boyfriend and 30 yo son    Prior Function Prior Level of Function : Independent/Modified Independent             Mobility Comments: no AD ADLs Comments: indep, drives     Extremity/Trunk Assessment   Upper Extremity Assessment Upper Extremity Assessment: Defer to OT evaluation;Right hand dominant RUE Deficits / Details: generalized weakness but WFL RUE Sensation: WNL RUE Coordination: WNL LUE Deficits / Details: Globally weak, poor proprioception/ unaware of where her arm is in space when assessing overhead ROM. Grip is grossly 3/5. Paraesthesias throughout. LUE Sensation: decreased light touch;decreased proprioception LUE Coordination: decreased gross motor;decreased fine motor    Lower Extremity Assessment Lower Extremity Assessment: LLE deficits/detail LLE Deficits / Details: knee extension 2+/5, trace ankle DF, 3+/5 hallux extension, 2-/5 hip flexion, trace knee flexion LLE Sensation: decreased light touch LLE Coordination: decreased gross motor    Cervical / Trunk Assessment Cervical / Trunk Assessment: Normal  Communication   Communication Communication: No apparent difficulties  Cognition Arousal: Alert Behavior During Therapy: Flat affect Overall Cognitive Status: Impaired/Different from  baseline Area of Impairment: Awareness, Safety/judgement                         Safety/Judgement: Decreased awareness of safety, Decreased awareness of deficits Awareness: Emergent   General Comments: Difficult to assess, pt with limited insight into safety and deficits. Refusing most care at the hospital. Follows simple 1 step commands well. Would benefit from a higher level assessment. Pt seemed to have a visual attention impairment but it needs further assessment        General Comments General comments (skin integrity, edema, etc.): VSS, SO present    Exercises     Assessment/Plan    PT Assessment Patient needs continued PT services  PT Problem List Decreased strength;Decreased range of motion;Decreased activity tolerance;Decreased balance;Decreased mobility;Decreased coordination;Decreased cognition;Decreased knowledge of use of DME;Decreased safety awareness;Decreased knowledge of precautions;Impaired sensation;Impaired tone;Obesity       PT Treatment Interventions DME instruction;Gait training;Functional mobility training;Therapeutic activities;Therapeutic exercise;Balance training;Neuromuscular re-education;Stair training;Patient/family education;Cognitive remediation;Wheelchair mobility training    PT Goals (Current goals can be found in the Care Plan section)  Acute Rehab PT Goals Patient Stated Goal: Thinking about post-acute rehab PT Goal Formulation: With patient Time For Goal Achievement: 09/15/22 Potential to Achieve Goals: Fair    Frequency Min 1X/week     Co-evaluation PT/OT/SLP Co-Evaluation/Treatment: Yes Reason for Co-Treatment: Complexity of the patient's impairments (multi-system involvement);For patient/therapist safety;To address functional/ADL transfers PT goals addressed during session: Mobility/safety with mobility;Balance;Proper use of DME OT goals addressed during session: ADL's and self-care       AM-PAC PT "6 Clicks" Mobility   Outcome Measure Help needed turning from your back to your side while in  a flat bed without using bedrails?: A Lot Help needed moving from lying on your back to sitting on the side of a flat bed without using bedrails?: A Lot Help needed moving to and from a bed to a chair (including a wheelchair)?: Total Help needed standing up from a chair using your arms (e.g., wheelchair or bedside chair)?: Total Help needed to walk in hospital room?: Total Help needed climbing 3-5 steps with a railing? : Total 6 Click Score: 8    End of Session Equipment Utilized During Treatment: Gait belt Activity Tolerance: Patient limited by fatigue Patient left: in bed;with call bell/phone within reach;with bed alarm set;with family/visitor present   PT Visit Diagnosis: Unsteadiness on feet (R26.81);Other abnormalities of gait and mobility (R26.89);Hemiplegia and hemiparesis;History of falling (Z91.81) Hemiplegia - Right/Left: Left Hemiplegia - dominant/non-dominant: Non-dominant Hemiplegia - caused by: Cerebral infarction    Time: 1512-1530 PT Time Calculation (min) (ACUTE ONLY): 18 min   Charges:   PT Evaluation $PT Eval Moderate Complexity: 1 Mod   PT General Charges $$ ACUTE PT VISIT: 1 Visit         Kathlyn Sacramento, PT, DPT [facility]  Rehabilitation Services Physical Therapist Office: [phone] Website: [email or web address]   Berton Mount 09/02/2022, 4:34 PM

## 2022-09-02 NOTE — Significant Event (Signed)
Patient attempted to ambulate to hallway and was unsteady and unable to make it past door, staff member helped patient ambulate safely back to bed. Patient's states that she wants to go home because she can't go outside, can't smoke, and has dirty smelly clothes on. Patient's significant other stated that he is going home to get her some clean clothes. This nurse asks patient if it would be helpful if we helped her get clean once she had clean clothes and get a nicotine patch, patient stated that 'sounds good'. Patient was assisted by bedside nurse back in the bed, fall precautions in place. Nursing and MD made aware.

## 2022-09-02 NOTE — Progress Notes (Signed)
Rachel Wilcox came down to mri at [place] and refused her scan! She was sent back up!

## 2022-09-02 NOTE — Progress Notes (Signed)
PROGRESS NOTE    Rachel Wilcox  WUJ:[id number] DOB: 30-Sep-1950 DOA: 09/01/2022 PCP: Wilmer Floor, NP    Brief Narrative:  Rachel Wilcox is a 72 y.o. female with past medical history of CHF, diabetes, arthritis and hypertension was in a usual state of health until morning of presentation when she had acute onset of left-sided weakness with gait difficulty.  Patient then presented to the ED for further evaluation. .  Noncontrast head CT without acute findings.  MRI was recommended and neurology was consulted.  Patient was then admitted hospital for further evaluation and treatment.   Assessment and plan.  Acute CVA.  Had left-sided weakness. CT head scan was negative for acute findings.  MRI of the brain without contrast showing acute perforator infarct involving the right internal capsule and will lateral thalamus with punctuate infarct at the right insula.  Some artifact was noted.  Lipid panel with LDL of 119.  Hemoglobin A1c of 7.4 from previous 6.8.  HIV nonreactive.  TSH of 2.2.  Urine drug screen was negative.  Alcohol level was less than 10.  Urinalysis negative for infection.  Continue aspirin. CT angiogram of the head and neck ending and patient is refusing IV placement.  Check carotid duplex, 2D echocardiogram pending.   Mild hypokalemia Replenished orally.  Check BMP today.   History of hypertension - Hold amlodipine and lisinopril for permissive hypertension at this time.  Latest blood pressure 120/77   Diabetes mellitus type 2. Hemoglobin A1c of 7.4 at this time.  On glimepiride at home.  Will continue sliding scale insulin, Accu-Cheks, diabetic diet.    DVT prophylaxis: enoxaparin (LOVENOX) injection 40 mg Start: 09/02/22 1000 SCD's Start: 09/02/22 0118   Code Status:     Code Status: Full Code  Disposition: Home likely in 1 to 2 days  Status is: Observation  The patient will require care spanning > 2 midnights and should be moved to inpatient because: Acute stroke,  need for PT OT evaluation, further imaging,   Family Communication: Spoke with the patient's husband at bedside.  Consultants:  Neurology  Procedures:  None  Antimicrobials:  None  Anti-infectives (From admission, onward)    None       Subjective:  Today, patient was seen and examined at bedside.  Patient states that she wants to go home and has been refusing IV placement.  Spoke with her and husband at bedside regarding risk of stroke and possible life-threatening complications.  Objective: Vitals:   09/02/22 0101 09/02/22 0329 09/02/22 0744 09/02/22 1110  BP: (!) 145/130 120/77 (!) 152/88 (!) 156/96  Pulse: 73 90 (!) 101 100  Resp: 18 19 19 18   Temp: 98 F (36.7 C) 98 F (36.7 C) 98.4 F (36.9 C) 98.2 F (36.8 C)  TempSrc:   Oral Oral  SpO2: 99% 96% 98% 99%  Weight: 80.7 kg     Height: 5\' 4"  (1.626 m)       Intake/Output Summary (Last 24 hours) at 09/02/2022 1412 Last data filed at 09/02/2022 0610 Gross per 24 hour  Intake 19.7 ml  Output --  Net 19.7 ml   Filed Weights   09/01/22 2035 09/02/22 0101  Weight: 80.7 kg 80.7 kg    Physical Examination: Body mass index is 30.55 kg/m.   General: Obese built, not in obvious distress HENT:   No scleral pallor or icterus noted. Oral mucosa is moist.  Chest:  Clear breath sounds.  Diminished breath sounds bilaterally. No crackles or wheezes.  CVS: S1 &S2 heard. No murmur.  Regular rate and rhythm. Abdomen: Soft, nontender, nondistended.  Bowel sounds are heard.   Extremities: No cyanosis, clubbing or edema.  Left upper extremity weakness. Psych: Alert, awake and oriented, normal mood CNS:  No cranial nerve deficits.  Left upper extremity weakness compared to the right. Skin: Warm and dry.  No rashes noted.  Data Reviewed:   CBC: Recent Labs  Lab 09/01/22 2109 09/01/22 2121 09/02/22 0142  WBC 5.3  --  5.5  NEUTROABS 2.0  --   --   HGB 14.0 14.3 13.6  HCT 42.6 42.0 42.7  MCV 89.9  --  87.3  PLT 203   --  213    Basic Metabolic Panel: Recent Labs  Lab 09/01/22 2109 09/01/22 2121 09/02/22 0142 09/02/22 0535  NA 138 139 138  --   K 3.2* 3.1* 3.2*  --   CL 99 101 100  --   CO2 24  --  32  --   GLUCOSE 123* 122* 127*  --   BUN 14 14 12   --   CREATININE 0.61 0.60 0.62  0.64  --   CALCIUM 9.1  --  9.0  --   MG  --   --   --  1.9    Liver Function Tests: Recent Labs  Lab 09/01/22 2109  AST 17  ALT 12  ALKPHOS 95  BILITOT 0.2*  PROT 7.1  ALBUMIN 3.2*     Radiology Studies: MR BRAIN WO CONTRAST  Result Date: 09/02/2022 CLINICAL DATA:  Acute stroke suspected EXAM: MRI HEAD WITHOUT CONTRAST TECHNIQUE: Multiplanar, multiecho pulse sequences of the brain and surrounding structures were obtained without intravenous contrast. COMPARISON:  Head CT from yesterday FINDINGS: Brain: Wedge of restricted diffusion at the right internal capsule and thalamus. Tiny area of acute cortical infarct at the posterior right insula. No acute hemorrhage, hydrocephalus, mass, or collection. Vascular: Grossly preserved flow voids Skull and upper cervical spine: No gross marrow lesion Sinuses/Orbits: Unremarkable. Other: Intermittently significant motion artifact. IMPRESSION: 1. Acute perforator infarct involving the right internal capsule and lateral thalamus. Punctate acute infarct at the right insula. 2. Intermittently significant motion artifact. Electronically Signed   By: Tiburcio Pea M.D.   On: 09/02/2022 12:40   DG Chest Port 1 View  Result Date: 09/02/2022 CLINICAL DATA:  Left-sided weakness EXAM: PORTABLE CHEST 1 VIEW COMPARISON:  06/13/2017 FINDINGS: Cardiac shadow is mildly prominent but accentuated by the portable technique. Aortic calcifications are noted. The lungs are clear. No bony abnormality is noted. IMPRESSION: No active disease. Electronically Signed   By: Alcide Clever M.D.   On: 09/02/2022 02:08   CT HEAD WO CONTRAST  Result Date: 09/01/2022 CLINICAL DATA:  Extremity weakness  EXAM: CT HEAD WITHOUT CONTRAST TECHNIQUE: Contiguous axial images were obtained from the base of the skull through the vertex without intravenous contrast. RADIATION DOSE REDUCTION: This exam was performed according to the departmental dose-optimization program which includes automated exposure control, adjustment of the mA and/or kV according to patient size and/or use of iterative reconstruction technique. COMPARISON:  None Available. FINDINGS: Brain: No evidence of acute infarction, hemorrhage, hydrocephalus, extra-axial collection or mass lesion/mass effect. Vascular: No hyperdense vessel or unexpected calcification. Skull: Normal. Negative for fracture or focal lesion. Sinuses/Orbits: No acute finding. Other: None. IMPRESSION: No acute intracranial abnormality noted. Electronically Signed   By: Alcide Clever M.D.   On: 09/01/2022 21:55      LOS: 0 days    Lovetta Condie  Katora Fini, MD [facility] Available via Epic secure chat 7am-7pm After these hours, please refer to coverage provider listed on [email or web address] 09/02/2022, 2:13 PM

## 2022-09-02 NOTE — Progress Notes (Signed)
Pt refused all her scheduled medications at this time. See MAR. Purposes explained to pt. Dr. Antionette Char aware

## 2022-09-02 NOTE — Progress Notes (Signed)
OT Cancellation Note  Patient Details Name: Rachel Wilcox MRN: [id number] DOB: [birth year]   Cancelled Treatment:    Reason Eval/Treat Not Completed: Patient at procedure or test/ unavailable;Active bedrest order (OTF for MRI and has active bed rest, OT to follow up when available)  Donia Pounds 09/02/2022, 12:03 PM

## 2022-09-02 NOTE — Progress Notes (Signed)
Pt seen for assessment. Alert and oriented x 4. Discussed plan of care for this shift. Pt refused the following: IV access placement Telemetry  Blood sugar checks Novolog sliding scales Xalatan eye drops  Safety precautions maintained; bed alarms on, floor mats in place, bed wheel locked.

## 2022-09-02 NOTE — Progress Notes (Signed)
Patient is adamant about leaving despite the risk involved. The visitor at bedside of no assistance. Patient is not even able to maintain balance while sitting on the side of the bed. She feel back while I was speaking to her. Her reasoning for leaving is that she cannot go outside or "do anything". She just has to stay here. I explained this a hospital and she is here to be treated. I asked her if she would at least agree to stay another day and she said no. She is now saying she will take a cab home but is likely not going to be able to walk off the unit. Will continue to follow.

## 2022-09-02 NOTE — Plan of Care (Signed)
  Problem: Fluid Volume: Goal: Ability to maintain a balanced intake and output will improve Outcome: Progressing   Problem: Health Behavior/Discharge Planning: Goal: Ability to identify and utilize available resources and services will improve Outcome: Progressing   Problem: Skin Integrity: Goal: Risk for impaired skin integrity will decrease Outcome: Progressing   Problem: Elimination: Goal: Will not experience complications related to urinary retention Outcome: Progressing

## 2022-09-02 NOTE — Progress Notes (Signed)
Patient alert and oriented X4, still refusing to put the tele back on, refusing for IV insertion,  and diagnostic tests, but is taking oral meds, and letting to do assessment, education provided on importance of completing the tests and procedure by everyone but still not wanting to do, will continue to monitor.

## 2022-09-02 NOTE — Significant Event (Addendum)
This virtual nurse camera's in to check on patient due to bed alarm going off. Patient stated that she wanted to leave and go home. Patient was educated that she was admitted for a stroke work up, patient verbalized that she was understanding of why she was here and the risk she was taking by leaving AMA. Primary nurse, charge nurse, and MD made aware. Bedside nurse in room at bedside speaking with patient currently about situation and the safety of patient attempting to walk to elevator due to unsteadiness, this nurse also verbalizes concern to patient about these matters. Patient still states that she wants to go home due to not being able to leave the room.

## 2022-09-02 NOTE — Progress Notes (Signed)
Patient still refusing telemetry and don't want to have an IV access.

## 2022-09-02 NOTE — Progress Notes (Signed)
Admitted pt from the ED via stretcher. Pt is alert and oriented. Left sided weakness. Pt insisted to use the bathroom but can barely make a step with two person assist, offered bedside commode. Assited back to bed, alarms on, wheels locked. [facility] Swallowing test done, passed. Dr. Antionette Char notified, received diet orders.  Pt refuses eye drops that is scheduled at this time. She states it does not sound like what she is taking at home. She also refused the potassium and NS maintenance fluid at this time. Purposes and need for the said medications explained to the patient with the significant other present at the bedside, but pt still refuses. Dr. Antionette Char informed and aware. Will try to convince patient at later time.

## 2022-09-02 NOTE — Evaluation (Signed)
Occupational Therapy Evaluation Patient Details Name: Rachel Wilcox MRN: [id number] DOB: [birth year] Today's Date: 09/02/2022   History of Present Illness 72 y.o. female admitted 8/23 following acute onset of left-sided weakness with gait difficulty.   Imaging + Acute CVA. 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   Clinical Impression   Rachel Wilcox was evaluated s/p the above admission list. She is indep and lives with her boyfriend and son at baseline. Upon evaluation the pt was limited by L weakness and sensory motor deficits, impaired cognition, unsteady sitting and standing balance, decreased activity tolerance and questions vision/visual attention deficits. Overall she needed mod A for bed mobility, min A +2 to stand at EOB and mod A +2 for short mobility with RW. Due to the deficits listed below the pt also needs up to max A for LB ADLs and min A for UB ADLs. Pt will benefit from continued acute OT services and intensive inpatient follow up therapy, >3 hours/day after discharge.        If plan is discharge home, recommend the following: A lot of help with walking and/or transfers;A lot of help with bathing/dressing/bathroom;Assistance with cooking/housework;Direct supervision/assist for medications management;Direct supervision/assist for financial management;Assist for transportation;Help with stairs or ramp for entrance    Functional Status Assessment  Patient has had a recent decline in their functional status and demonstrates the ability to make significant improvements in function in a reasonable and predictable amount of time.  Equipment Recommendations  Other (comment) (defer)    Recommendations for Other Services Rehab consult     Precautions / Restrictions Precautions Precautions: Fall Restrictions Weight Bearing Restrictions: No      Mobility  Bed Mobility Overal bed mobility: Needs Assistance Bed Mobility: Sit to Supine, Supine to Sit     Supine to sit: Mod assist Sit to supine: Mod assist        Transfers Overall transfer level: Needs assistance Equipment used: Rolling walker (2 wheels) Transfers: Sit to/from Stand Sit to Stand: Min assist, +2 safety/equipment, +2 physical assistance, Mod assist           General transfer comment: min A+2 or mod A +1          ADL either performed or assessed with clinical judgement   ADL Overall ADL's : Needs assistance/impaired Eating/Feeding: Set up;Sitting   Grooming: Set up;Sitting   Upper Body Bathing: Minimal assistance;Sitting   Lower Body Bathing: Maximal assistance;Sit to/from stand   Upper Body Dressing : Minimal assistance;Sitting   Lower Body Dressing: Maximal assistance;Sit to/from stand   Toilet Transfer: Moderate assistance;+2 for physical assistance;+2 for safety/equipment   Toileting- Clothing Manipulation and Hygiene: Maximal assistance;Sit to/from stand       Functional mobility during ADLs: Moderate assistance;+2 for physical assistance;+2 for safety/equipment General ADL Comments: limited by L hemi weakness, balance     Vision Baseline Vision/History: 1 Wears glasses Patient Visual Report: No change from baseline Vision Assessment?: Vision impaired- to be further tested in functional context Additional Comments: Occular ROM WFL, seems to have limitations in R superior visual field. Would recomend further assessment of visual attention and scanning     Perception Perception: Not tested       Praxis Praxis: Not tested       Pertinent Vitals/Pain Pain Assessment Pain Assessment: No/denies pain     Extremity/Trunk Assessment Upper Extremity Assessment Upper Extremity Assessment: RUE deficits/detail;LUE deficits/detail;Right hand dominant  RUE Deficits / Details: generalized weakness but WFL RUE Sensation: WNL RUE Coordination:  WNL LUE Deficits / Details: Globally weak, poor proprioception/ unaware of where her arm is in space when assessing overhead ROM. Grip is grossly 3/5. Paraesthesias throughout. LUE Sensation: decreased light touch;decreased proprioception LUE Coordination: decreased gross motor;decreased fine motor   Lower Extremity Assessment Lower Extremity Assessment: Defer to PT evaluation   Cervical / Trunk Assessment Cervical / Trunk Assessment: Normal   Communication Communication Communication: No apparent difficulties   Cognition Arousal: Alert Behavior During Therapy: Flat affect Overall Cognitive Status: Impaired/Different from baseline Area of Impairment: Awareness, Safety/judgement               Safety/Judgement: Decreased awareness of safety, Decreased awareness of deficits Awareness: Emergent   General Comments: Difficult to assess, pt with limited insight into safety and deficits. Refusing most care at the hospital. Follows simple 1 step commands well. Would benefit from a higher level assessment. Pt seemed to have a visual attention impairment but it needs further assessment     General Comments  VSS, SO present            Home Living Family/patient expects to be discharged to:: Private residence Living Arrangements: Spouse/significant other;Children Available Help at Discharge: Family Type of Home: House Home Access: Stairs to enter Secretary/administrator of Steps: 2 Entrance Stairs-Rails: None Home Layout: One level     Bathroom Shower/Tub: Chief Strategy Officer: Standard Bathroom Accessibility: No   Home Equipment: Cane - single Librarian, academic (2 wheels);Hand held shower head;BSC/3in1   Additional Comments: lives with boyfriend and 18 yo son  Lives With: Significant other;Son    Prior Functioning/Environment Prior Level of Function : Independent/Modified Independent             Mobility Comments: no AD ADLs Comments: indep,  drives        OT Problem List: Decreased strength;Decreased range of motion;Decreased activity tolerance;Impaired balance (sitting and/or standing);Decreased safety awareness;Decreased knowledge of use of DME or AE;Decreased knowledge of precautions;Decreased cognition;Impaired UE functional use;Pain      OT Treatment/Interventions: Self-care/ADL training;Therapeutic exercise;DME and/or AE instruction;Neuromuscular education;Therapeutic activities;Patient/family education;Balance training    OT Goals(Current goals can be found in the care plan section) Acute Rehab OT Goals Patient Stated Goal: go home OT Goal Formulation: With patient Time For Goal Achievement: 09/16/22 Potential to Achieve Goals: Good ADL Goals Pt Will Perform Lower Body Bathing: with min assist;sit to/from stand Pt Will Perform Lower Body Dressing: sit to/from stand Pt Will Transfer to Toilet: with min assist;ambulating;regular height toilet Additional ADL Goal #1: Pt will complete IADL medication management task with min cues  OT Frequency: Min 1X/week    Co-evaluation PT/OT/SLP Co-Evaluation/Treatment: Yes Reason for Co-Treatment: Complexity of the patient's impairments (multi-system involvement);For patient/therapist safety;To address functional/ADL transfers   OT goals addressed during session: ADL's and self-care      AM-PAC OT "6 Clicks" Daily Activity     Outcome Measure Help from another person eating meals?: A Little Help from another person taking care of personal grooming?: A Little Help from another person toileting, which includes using toliet, bedpan, or urinal?: A Lot Help from another person bathing (including washing, rinsing, drying)?: A Lot Help from another person to put on and taking off regular upper body clothing?: A Little Help from another person to put on and taking off regular lower body clothing?: A Lot 6 Click Score: 15   End of Session Equipment Utilized During Treatment: Rolling  walker (2 wheels);Gait belt Nurse Communication: Mobility status  Activity Tolerance: Patient tolerated treatment well Patient left: in bed;with call bell/phone within reach;with bed alarm set  OT Visit Diagnosis: Unsteadiness on feet (R26.81);Other abnormalities of gait and mobility (R26.89);Muscle weakness (generalized) (M62.81);Hemiplegia and hemiparesis Hemiplegia - Right/Left: Left Hemiplegia - dominant/non-dominant: Non-Dominant                Time: 8295-6213 OT Time Calculation (min): 18 min Charges:  OT General Charges $OT Visit: 1 Visit OT Evaluation $OT Eval Moderate Complexity: 1 Mod  Derenda Mis, OTR/L Acute Rehabilitation Services Office [phone] Secure Chat Communication Preferred   Donia Pounds 09/02/2022, 4:09 PM

## 2022-09-02 NOTE — Evaluation (Signed)
Speech Language Pathology Evaluation Patient Details Name: Rachel Wilcox MRN: [id number] DOB: 1950/01/28 Today's Date: 09/02/2022 Time: 1451-1510 SLP Time Calculation (min) (ACUTE ONLY): 19 min  Problem List:  Patient Active Problem List   Diagnosis Date Noted   Ischemic stroke (HCC) 09/02/2022   Left-sided weakness 09/01/2022   Diabetes mellitus (HCC) 09/09/2008   HYPOKALEMIA, HX OF 09/09/2008   TOBACCO ABUSE, HX OF 09/09/2008   CHEST PAIN, ATYPICAL, HX OF 09/09/2008   BREAST BIOPSY, HX OF 09/09/2008   BREAST MASS, RIGHT 06/11/2008   OBESITY NOS 09/06/2006   ANXIETY 09/06/2006   TOBACCO USER 09/06/2006   DEPRESSION 09/06/2006   HYPERTENSION 09/06/2006   GERD 09/06/2006   ECZEMA 09/06/2006   DEGENERATIVE JOINT DISEASE 09/06/2006   HEADACHE 09/06/2006   Past Medical History:  Past Medical History:  Diagnosis Date   Arthritis    CHF (congestive heart failure) (HCC)    Diabetes mellitus    Glaucoma    Hypertension    Past Surgical History:  Past Surgical History:  Procedure Laterality Date   CESAREAN SECTION     WISDOM TOOTH EXTRACTION     HPI:  Rachel Wilcox is a 72 y.o. female with past medical history of CHF, diabetes, arthritis and hypertension was in a usual state of health until morning of presentation when she had acute onset of left-sided weakness with gait difficulty. MRI of the brain without contrast showing acute perforator infarct involving the right internal capsule and will lateral thalamus with punctuate infarct at the right insula.   Assessment / Plan / Recommendation Clinical Impression  Pt presents with mild-moderate cognitive linguistic deficits in the areas of short-term memory, numeric problem solving, attention, and executive function.  Pt reported that she lives with her s/o and adult son and that she is fully independent with ADLs and IADLs at baseline.  Pt completed portions of the [facility] Mental Status Examination in addition to  informal evaluation measures.  Speech was noted to be mildly slowed and pt's s/o reported that this was a change from baseline, but speech was fully intelligible to an unfamiliar listener.  Expressive/receptive language appeared functional.  During clock drawing task, pt was observed to only draw numbers on the right side of the clock.  When asked about her vision on the left, pt reported that she was "too tired" and could not continue the evaluation any further.  Recommend continued ST acutely and at time of discharge.  Additionally recommend supervision with IADLs (including medication and financial management) at time of discharge.  Pt and s/o were educated regarding results and recommendations.    SLP Assessment  SLP Recommendation/Assessment: Patient needs continued Speech Lanaguage Pathology Services SLP Visit Diagnosis: Cognitive communication deficit (R41.841)    Recommendations for follow up therapy are one component of a multi-disciplinary discharge planning process, led by the attending physician.  Recommendations may be updated based on patient status, additional functional criteria and insurance authorization.    Follow Up Recommendations  Home health SLP    Assistance Recommended at Discharge  Set up Supervision/Assistance  Functional Status Assessment Patient has had a recent decline in their functional status and demonstrates the ability to make significant improvements in function in a reasonable and predictable amount of time.  Frequency and Duration min 2x/week  2 weeks      SLP Evaluation Cognition  Overall Cognitive Status: Impaired/Different from baseline Arousal/Alertness: Awake/alert Orientation Level: Oriented X4 Attention: Sustained;Focused Focused Attention: Impaired Focused Attention Impairment: Verbal complex Sustained  Attention: Impaired Sustained Attention Impairment: Verbal complex Memory: Impaired Memory Impairment: Decreased recall of new  information Awareness: Impaired Awareness Impairment: Emergent impairment Problem Solving: Impaired Problem Solving Impairment: Verbal complex       Comprehension  Auditory Comprehension Overall Auditory Comprehension: Appears within functional limits for tasks assessed    Expression Expression Primary Mode of Expression: Verbal Verbal Expression Overall Verbal Expression: Appears within functional limits for tasks assessed   Oral / Motor  Motor Speech Overall Motor Speech: Appears within functional limits for tasks assessed           Rachel Wilcox, M.S., CCC-SLP Acute Rehabilitation Services Office: [phone]  Rachel Wilcox [facility] 09/02/2022, 3:23 PM

## 2022-09-03 ENCOUNTER — Encounter (HOSPITAL_COMMUNITY): Payer: Medicare Other

## 2022-09-03 ENCOUNTER — Inpatient Hospital Stay (HOSPITAL_COMMUNITY): Payer: Medicare Other

## 2022-09-03 DIAGNOSIS — R531 Weakness: Secondary | ICD-10-CM | POA: Diagnosis not present

## 2022-09-03 DIAGNOSIS — I6389 Other cerebral infarction: Secondary | ICD-10-CM | POA: Diagnosis not present

## 2022-09-03 DIAGNOSIS — E119 Type 2 diabetes mellitus without complications: Secondary | ICD-10-CM | POA: Diagnosis not present

## 2022-09-03 DIAGNOSIS — I639 Cerebral infarction, unspecified: Secondary | ICD-10-CM | POA: Diagnosis not present

## 2022-09-03 DIAGNOSIS — I10 Essential (primary) hypertension: Secondary | ICD-10-CM | POA: Diagnosis not present

## 2022-09-03 LAB — CBC
HCT: 39.9 % (ref 36.0–46.0)
Hemoglobin: 12.8 g/dL (ref 12.0–15.0)
MCH: 28.1 pg (ref 26.0–34.0)
MCHC: 32.1 g/dL (ref 30.0–36.0)
MCV: 87.7 fL (ref 80.0–100.0)
Platelets: 209 10*3/uL (ref 150–400)
RBC: 4.55 MIL/uL (ref 3.87–5.11)
RDW: 14.8 % (ref 11.5–15.5)
WBC: 5.1 10*3/uL (ref 4.0–10.5)
nRBC: 0 % (ref 0.0–0.2)

## 2022-09-03 LAB — BASIC METABOLIC PANEL
Anion gap: 8 (ref 5–15)
BUN: 9 mg/dL (ref 8–23)
CO2: 27 mmol/L (ref 22–32)
Calcium: 8.8 mg/dL — ABNORMAL LOW (ref 8.9–10.3)
Chloride: 101 mmol/L (ref 98–111)
Creatinine, Ser: 0.68 mg/dL (ref 0.44–1.00)
GFR, Estimated: >60 mL/min (ref >60)
Glucose, Bld: 84 mg/dL (ref 70–99)
Potassium: 3.6 mmol/L (ref 3.5–5.1)
Sodium: 136 mmol/L (ref 135–145)

## 2022-09-03 LAB — MAGNESIUM: Magnesium: 2 mg/dL (ref 1.7–2.4)

## 2022-09-03 LAB — GLUCOSE, CAPILLARY: Glucose-Capillary: 183 mg/dL — ABNORMAL HIGH (ref 70–99)

## 2022-09-03 MED ORDER — ASPIRIN 81 MG PO TBEC: 81.0000 mg | DELAYED_RELEASE_TABLET | Freq: Every day | ORAL | 0 refills | Status: AC

## 2022-09-03 MED ORDER — CLOPIDOGREL BISULFATE 75 MG PO TABS: 75.0000 mg | ORAL_TABLET | Freq: Every day | ORAL | 2 refills | Status: AC

## 2022-09-03 MED ORDER — NICOTINE 21 MG/24HR TD PT24: 21.0000 mg | MEDICATED_PATCH | Freq: Every day | TRANSDERMAL | 0 refills | Status: AC

## 2022-09-03 NOTE — Discharge Summary (Signed)
Physician Discharge Summary  Rachel Wilcox JYN:[id number] DOB: March 16, 1950 DOA: 09/01/2022  PCP: Wilmer Floor, NP  Admit date: 09/01/2022 Discharge date: 09/03/2022  Admitted From: Home  Discharge disposition: Left AGAINST MEDICAL ADVICE   Recommendations for Outpatient Follow-Up:   Left against AGAINST MEDICAL ADVICE.  Advised to follow-up with PCP as soon as possible.   Discharge Diagnosis:   Principal Problem:   Left-sided weakness Active Problems:   Diabetes mellitus (HCC)   HYPERTENSION   Ischemic stroke (HCC)   Discharge Condition: Stable.  Diet recommendation: Low sodium, heart healthy.  Carbohydrate-modified.    Wound care: None.  Code status: Full.  History of Present Illness:   Rachel Wilcox is a 72 y.o. female with past medical history of CHF, diabetes, arthritis and hypertension was in a usual state of health until morning of presentation when she had acute onset of left-sided weakness with gait difficulty. Patient then presented to the ED for further evaluation. . Noncontrast head CT without acute findings. MRI was recommended and neurology was consulted. Patient was then admitted hospital for further evaluation and treatment   Hospital Course:   Following conditions were addressed during hospitalization as listed below,  Acute CVA.   Had left-sided weakness. CT head scan was negative for acute findings.  MRI of the brain without contrast showing acute perforator infarct involving the right internal capsule and will lateral thalamus with punctuate infarct at the right insula.  Some artifact was noted.  Lipid panel with LDL of 119.  Hemoglobin A1c of 7.4 from previous 6.8.  HIV nonreactive.  TSH of 2.2.  Urine drug screen was negative.  Alcohol level was less than 10.  Urinalysis negative for infection.  Patient was a started on aspirin and Plavix during hospitalization.  CT angiogram of the head and neck pending since patient is refusing IV placement.   Pending carotid duplex, 2D echocardiogram and patient has decided to leave AGAINST MEDICAL ADVICE despite explaining the risk of worsening stroke and incomplete workup.  She understands the risk of further stroke in the future but despite of prolonged conversation she has opted to leave AGAINST MEDICAL ADVICE.  I have offered her and send prescription of aspirin and Plavix to her pharmacy on record on discharge as recommended by neurology.    Mild hypokalemia Replenished and improved.  Potassium today at 3.6  History of hypertension Patient is on amlodipine and lisinopril at home which was on hold during hospitalization.    Diabetes mellitus type 2. Hemoglobin A1c of 7.4 at this time.  On glimepiride at home.  Received sliding scale during hospitalization.  Disposition.  At this time, patient has left AGAINST MEDICAL ADVICE.  Patient's husband at bedside.  Medical Consultants:   Neurology  Procedures:    None Subjective:   Today, patient seen and examined at bedside.  Inquiring about going home despite explaining the risk of stroke and need for further workup.  Patient's husband at bedside.  Discharge Exam:   Vitals:   09/03/22 0352 09/03/22 0802  BP: (!) 152/73 125/78  Pulse: 90 91  Resp: 18 17  Temp: 98 F (36.7 C) (!) 97.4 F (36.3 C)  SpO2: 93% 97%   Vitals:   09/02/22 2004 09/02/22 2340 09/03/22 0352 09/03/22 0802  BP: (!) 152/71 125/61 (!) 152/73 125/78  Pulse: 87 89 90 91  Resp: 19 19 18 17   Temp: 98.4 F (36.9 C) 98.1 F (36.7 C) 98 F (36.7 C) (!) 97.4 F (36.3 C)  TempSrc: Oral Oral Oral Oral  SpO2: 98% 93% 93% 97%  Weight:      Height:       Body mass index is 30.55 kg/m.  General: Alert awake, not in obvious distress HENT: pupils equally reacting to light,  No scleral pallor or icterus noted. Oral mucosa is moist.  Chest:  Clear breath sounds.  . No crackles or wheezes.  CVS: S1 &S2 heard. No murmur.  Regular rate and rhythm. Abdomen: Soft,  nontender, nondistended.  Bowel sounds are heard.   Extremities: No cyanosis, clubbing or edema.  Left upper extremity weakness. Psych: Alert, awake and oriented, normal mood CNS: Speech intact.  Left upper extremity weakness. Skin: Warm and dry.  No rashes noted.  The results of significant diagnostics from this hospitalization (including imaging, microbiology, ancillary and laboratory) are listed below for reference.     Diagnostic Studies:   MR BRAIN WO CONTRAST  Result Date: 09/02/2022 CLINICAL DATA:  Acute stroke suspected EXAM: MRI HEAD WITHOUT CONTRAST TECHNIQUE: Multiplanar, multiecho pulse sequences of the brain and surrounding structures were obtained without intravenous contrast. COMPARISON:  Head CT from yesterday FINDINGS: Brain: Wedge of restricted diffusion at the right internal capsule and thalamus. Tiny area of acute cortical infarct at the posterior right insula. No acute hemorrhage, hydrocephalus, mass, or collection. Vascular: Grossly preserved flow voids Skull and upper cervical spine: No gross marrow lesion Sinuses/Orbits: Unremarkable. Other: Intermittently significant motion artifact. IMPRESSION: 1. Acute perforator infarct involving the right internal capsule and lateral thalamus. Punctate acute infarct at the right insula. 2. Intermittently significant motion artifact. Electronically Signed   By: Tiburcio Pea M.D.   On: 09/02/2022 12:40   DG Chest Port 1 View  Result Date: 09/02/2022 CLINICAL DATA:  Left-sided weakness EXAM: PORTABLE CHEST 1 VIEW COMPARISON:  06/13/2017 FINDINGS: Cardiac shadow is mildly prominent but accentuated by the portable technique. Aortic calcifications are noted. The lungs are clear. No bony abnormality is noted. IMPRESSION: No active disease. Electronically Signed   By: Alcide Clever M.D.   On: 09/02/2022 02:08   CT HEAD WO CONTRAST  Result Date: 09/01/2022 CLINICAL DATA:  Extremity weakness EXAM: CT HEAD WITHOUT CONTRAST TECHNIQUE:  Contiguous axial images were obtained from the base of the skull through the vertex without intravenous contrast. RADIATION DOSE REDUCTION: This exam was performed according to the departmental dose-optimization program which includes automated exposure control, adjustment of the mA and/or kV according to patient size and/or use of iterative reconstruction technique. COMPARISON:  None Available. FINDINGS: Brain: No evidence of acute infarction, hemorrhage, hydrocephalus, extra-axial collection or mass lesion/mass effect. Vascular: No hyperdense vessel or unexpected calcification. Skull: Normal. Negative for fracture or focal lesion. Sinuses/Orbits: No acute finding. Other: None. IMPRESSION: No acute intracranial abnormality noted. Electronically Signed   By: Alcide Clever M.D.   On: 09/01/2022 21:55     Labs:   Basic Metabolic Panel: Recent Labs  Lab 09/01/22 2109 09/01/22 2121 09/02/22 0142 09/02/22 0535 09/03/22 0423  NA 138 139 138  --  136  K 3.2* 3.1* 3.2*  --  3.6  CL 99 101 100  --  101  CO2 24  --  32  --  27  GLUCOSE 123* 122* 127*  --  84  BUN 14 14 12   --  9  CREATININE 0.61 0.60 0.62  0.64  --  0.68  CALCIUM 9.1  --  9.0  --  8.8*  MG  --   --   --  1.9  2.0   GFR Estimated Creatinine Clearance: 68.2 mL/min (by C-G formula based on SCr of 0.68 mg/dL). Liver Function Tests: Recent Labs  Lab 09/01/22 2109  AST 17  ALT 12  ALKPHOS 95  BILITOT 0.2*  PROT 7.1  ALBUMIN 3.2*   No results for input(s): "LIPASE", "AMYLASE" in the last 168 hours. No results for input(s): "AMMONIA" in the last 168 hours. Coagulation profile Recent Labs  Lab 09/01/22 2109  INR 1.0    CBC: Recent Labs  Lab 09/01/22 2109 09/01/22 2121 09/02/22 0142 09/03/22 0423  WBC 5.3  --  5.5 5.1  NEUTROABS 2.0  --   --   --   HGB 14.0 14.3 13.6 12.8  HCT 42.6 42.0 42.7 39.9  MCV 89.9  --  87.3 87.7  PLT 203  --  213 209   Cardiac Enzymes: No results for input(s): "CKTOTAL", "CKMB",  "CKMBINDEX", "TROPONINI" in the last 168 hours. BNP: Invalid input(s): "POCBNP" CBG: Recent Labs  Lab 09/02/22 0329 09/02/22 0737 09/02/22 1115 09/02/22 1653 09/03/22 0805  GLUCAP 192* 215* 133* 105* 183*   D-Dimer No results for input(s): "DDIMER" in the last 72 hours. Hgb A1c Recent Labs    09/02/22 0142  HGBA1C 7.4*   Lipid Profile Recent Labs    09/02/22 0535  CHOL 189  HDL 38*  LDLCALC 119*  TRIG 162*  CHOLHDL 5.0   Thyroid function studies Recent Labs    09/02/22 0142  TSH 2.284   Anemia work up No results for input(s): "VITAMINB12", "FOLATE", "FERRITIN", "TIBC", "IRON", "RETICCTPCT" in the last 72 hours. Microbiology No results found for this or any previous visit (from the past 240 hour(s)).   Discharge Instructions:   Left AGAINST MEDICAL ADVICE.  Prescribed aspirin and Plavix on discharge.     Time coordinating discharge: 39 minutes  Signed:  Alianny Toelle  [facility] 09/03/2022, 9:59 AM

## 2022-09-03 NOTE — Progress Notes (Signed)
PT left AMA. This RN and Havy CN Informed pt and pt partner of significant risk of disability and/or death by leaving AMA.  Pt stated she understood and signed AMA paperwork. Informed Dr Tyson Babinski.  Erick Blinks, RN

## 2022-09-03 NOTE — Progress Notes (Signed)
Inpatient Rehab Admissions Coordinator:  Consult received. Saw pt at bedside. She is not interested in pursuing CIR. TOC made aware.  Wolfgang Phoenix, MS, CCC-SLP Admissions Coordinator [phone]

## 2022-09-03 NOTE — Progress Notes (Signed)
   09/02/22 1800  Provider Notification  Provider Name/Title Dr. Pokhrel/ Attending  Date Provider Notified 09/03/22  Time Provider Notified 1800  Notification Reason Medical equipment refusal  Medical equipment refused/Pt. educated regarding refusal Telemetry / Continuous monitoring  Provider response Evaluate remotely   Refusing tele at this time.
